# Patient Record
Sex: Female | Born: 1970 | Hispanic: Yes | Marital: Single | Smoking: Never smoker
Health system: Southern US, Community
[De-identification: ages and names within clinical notes are randomized; demographics above are authoritative.]

## PROBLEM LIST (undated history)

## (undated) DIAGNOSIS — T7840XA Allergy, unspecified, initial encounter: Secondary | ICD-10-CM

## (undated) DIAGNOSIS — C801 Malignant (primary) neoplasm, unspecified: Secondary | ICD-10-CM

## (undated) DIAGNOSIS — G4730 Sleep apnea, unspecified: Secondary | ICD-10-CM

## (undated) DIAGNOSIS — D649 Anemia, unspecified: Secondary | ICD-10-CM

## (undated) DIAGNOSIS — F32A Depression, unspecified: Secondary | ICD-10-CM

## (undated) DIAGNOSIS — R87629 Unspecified abnormal cytological findings in specimens from vagina: Secondary | ICD-10-CM

## (undated) DIAGNOSIS — F419 Anxiety disorder, unspecified: Secondary | ICD-10-CM

## (undated) HISTORY — DX: Allergy, unspecified, initial encounter: T78.40XA

## (undated) HISTORY — DX: Unspecified abnormal cytological findings in specimens from vagina: R87.629

## (undated) HISTORY — DX: Sleep apnea, unspecified: G47.30

## (undated) HISTORY — DX: Malignant (primary) neoplasm, unspecified: C80.1

## (undated) HISTORY — PX: HYSTEROSCOPY WITH D & C: SHX1775

## (undated) HISTORY — DX: Anxiety disorder, unspecified: F41.9

## (undated) HISTORY — PX: BREAST REDUCTION WITH MASTOPEXY: SHX6465

## (undated) HISTORY — DX: Anemia, unspecified: D64.9

## (undated) HISTORY — PX: BUNIONECTOMY: SHX129

## (undated) HISTORY — DX: Depression, unspecified: F32.A

## (undated) HISTORY — PX: LAPAROSCOPIC GASTRIC SLEEVE RESECTION: SHX5895

## (undated) HISTORY — PX: ABDOMINOPLASTY: SUR9

---

## 2021-01-15 DIAGNOSIS — G5791 Unspecified mononeuropathy of right lower limb: Secondary | ICD-10-CM | POA: Insufficient documentation

## 2021-01-15 DIAGNOSIS — M2021 Hallux rigidus, right foot: Secondary | ICD-10-CM | POA: Insufficient documentation

## 2021-01-19 ENCOUNTER — Other Ambulatory Visit: Payer: Self-pay

## 2021-01-19 ENCOUNTER — Emergency Department (HOSPITAL_BASED_OUTPATIENT_CLINIC_OR_DEPARTMENT_OTHER)
Admission: EM | Admit: 2021-01-19 | Discharge: 2021-01-19 | Disposition: A | Discharge status: Home / Self Care | Payer: 59 | Attending: Emergency Medicine | Admitting: Emergency Medicine

## 2021-01-19 ENCOUNTER — Encounter (HOSPITAL_BASED_OUTPATIENT_CLINIC_OR_DEPARTMENT_OTHER): Payer: Self-pay

## 2021-01-19 DIAGNOSIS — S91011A Laceration without foreign body, right ankle, initial encounter: Secondary | ICD-10-CM | POA: Diagnosis not present

## 2021-01-19 DIAGNOSIS — S99911A Unspecified injury of right ankle, initial encounter: Secondary | ICD-10-CM | POA: Diagnosis present

## 2021-01-19 DIAGNOSIS — Z23 Encounter for immunization: Secondary | ICD-10-CM | POA: Insufficient documentation

## 2021-01-19 DIAGNOSIS — W268XXA Contact with other sharp object(s), not elsewhere classified, initial encounter: Secondary | ICD-10-CM | POA: Diagnosis not present

## 2021-01-19 MED ORDER — TETANUS-DIPHTH-ACELL PERTUSSIS 5-2.5-18.5 LF-MCG/0.5 IM SUSY
0.5000 mL | PREFILLED_SYRINGE | Freq: Once | INTRAMUSCULAR | Status: AC
  Administered 2021-01-19: 0.5 mL via INTRAMUSCULAR
  Filled 2021-01-19: qty 0.5

## 2021-01-19 MED ORDER — BACITRACIN ZINC 500 UNIT/GM EX OINT
TOPICAL_OINTMENT | Freq: Two times a day (BID) | CUTANEOUS | Status: DC
  Filled 2021-01-19: qty 28.35

## 2021-01-19 NOTE — ED Triage Notes (Signed)
Pt arrives with laceration to right ankle since yesterday

## 2021-01-19 NOTE — ED Provider Notes (Signed)
[facility] EMERGENCY DEPARTMENT Provider Note   CSN: [id number] Arrival date & time: 01/19/21  1118     History Chief Complaint  Patient presents with  . Laceration    Sydney Marshall is a 50 y.o. female.  50 year old female presents with complaint of laceration to her right ankle which occurred yesterday when she cut her ankle on the metal edge from her dryer vent.  Bleeding is controlled, last tetanus was less than 5 years ago.  No other injuries, no difficulty ambulating.        No past medical history on file.  There are no problems to display for this patient.     OB History   No obstetric history on file.     No family history on file.     Home Medications Prior to Admission medications   Not on File    Allergies    Patient has no known allergies.  Review of Systems   Review of Systems  Constitutional: Negative for fever.  Musculoskeletal: Negative for arthralgias, gait problem and myalgias.  Skin: Positive for wound.  Neurological: Negative for weakness and numbness.  Hematological: Does not bruise/bleed easily.    Physical Exam Updated Vital Signs BP 130/82 (BP Location: Right Arm)   Pulse 89   Temp 98.9 F (37.2 C) (Oral)   Resp 19   Ht 5\' 4"  (1.626 m)   Wt 81.6 kg   SpO2 99%   BMI 30.90 kg/m   Physical Exam Vitals and nursing note reviewed.  Constitutional:      General: She is not in acute distress.    Appearance: She is well-developed. She is not diaphoretic.  HENT:     Head: Normocephalic and atraumatic.  Cardiovascular:     Pulses: Normal pulses.  Pulmonary:     Effort: Pulmonary effort is normal.  Musculoskeletal:        General: Tenderness and signs of injury present.     Comments: 3 cm laceration to posterior lateral right ankle below the malleolus.  Bleeding is controlled.  Skin:    General: Skin is warm and dry.     Findings: No erythema or rash.  Neurological:     Mental Status: She is alert and  oriented to person, place, and time.     Sensory: No sensory deficit.     Motor: No weakness.     Gait: Gait normal.  Psychiatric:        Behavior: Behavior normal.     ED Results / Procedures / Treatments   Labs (all labs ordered are listed, but only abnormal results are displayed) Labs Reviewed - No data to display  EKG None  Radiology No results found.  Procedures Procedures   Medications Ordered in ED Medications  bacitracin ointment (has no administration in time range)    ED Course  I have reviewed the triage vital signs and the nursing notes.  Pertinent labs & imaging results that were available during my care of the patient were reviewed by me and considered in my medical decision making (see chart for details).  Clinical Course as of 01/19/21 1137  Tue Jan 19, 7354  2466 50 year old female with laceration to the right ankle which occurred yesterday.  On exam has a 3 cm laceration to the right posterior lateral ankle below the malleolus, wound edges retracted, wound is not bleeding.  At this time, I feel like closure would put her more at risk for developing infection.  Wound will  be cleaned and dressed with antibiotic ointment. [LM]    Clinical Course User Index [LM] Roque Lias   MDM Rules/Calculators/A&P                          Final Clinical Impression(s) / ED Diagnoses Final diagnoses:  Laceration of right ankle, initial encounter    Rx / DC Orders ED Discharge Orders    None       Tacy Learn, PA-C 01/19/21 1137    Long, Wonda Old[facility], MD 01/22/21 [phone]

## 2021-04-30 ENCOUNTER — Encounter: Payer: Self-pay | Admitting: Family Medicine

## 2021-04-30 ENCOUNTER — Other Ambulatory Visit (HOSPITAL_COMMUNITY)
Admission: RE | Admit: 2021-04-30 | Discharge: 2021-04-30 | Disposition: A | Discharge status: Home / Self Care | Payer: 59 | Source: Ambulatory Visit | Attending: Family Medicine | Admitting: Family Medicine

## 2021-04-30 ENCOUNTER — Other Ambulatory Visit: Payer: Self-pay

## 2021-04-30 ENCOUNTER — Ambulatory Visit (INDEPENDENT_AMBULATORY_CARE_PROVIDER_SITE_OTHER): Payer: 59 | Admitting: Family Medicine

## 2021-04-30 VITALS — BP 115/74 | HR 84 | Ht 63.0 in

## 2021-04-30 DIAGNOSIS — A6004 Herpesviral vulvovaginitis: Secondary | ICD-10-CM

## 2021-04-30 DIAGNOSIS — R87610 Atypical squamous cells of undetermined significance on cytologic smear of cervix (ASC-US): Secondary | ICD-10-CM

## 2021-04-30 DIAGNOSIS — Z124 Encounter for screening for malignant neoplasm of cervix: Secondary | ICD-10-CM

## 2021-04-30 DIAGNOSIS — Z01419 Encounter for gynecological examination (general) (routine) without abnormal findings: Secondary | ICD-10-CM

## 2021-04-30 DIAGNOSIS — R87810 Cervical high risk human papillomavirus (HPV) DNA test positive: Secondary | ICD-10-CM

## 2021-04-30 MED ORDER — VALACYCLOVIR HCL 1 G PO TABS: 500.0000 mg | ORAL_TABLET | Freq: Every day | ORAL | 0 refills | Status: DC

## 2021-04-30 NOTE — Progress Notes (Signed)
Hx of Auscus pap +HPv in March 2021 with [facility]. Kathrene Alu Rn  Last Mammogram done 12/30/2019 Normal- did have scattered fibroglandular densities. Kathrene Alu RN

## 2021-04-30 NOTE — Assessment & Plan Note (Signed)
Start on Suppression

## 2021-04-30 NOTE — Assessment & Plan Note (Signed)
Repeat pap this year.

## 2021-04-30 NOTE — Progress Notes (Signed)
Subjective:     Sydney Marshall is a 50 y.o. female and is here for a comprehensive physical exam. The patient reports problems - h/o ASC-US with + HPV s/p colpo last year. She, also had some abnormal bleeding during that time and underwent a D & C with hysteroscopy and cervical polyp removal. Normal pathology seen.   The following portions of the patient's history were reviewed and updated as appropriate: allergies, current medications, past family history, past medical history, past social history, past surgical history, and problem list.  Review of Systems Pertinent items noted in HPI and remainder of comprehensive ROS otherwise negative.   Objective:    BP 115/74   Pulse 84   Ht '5\' 3"'$  (1.6 m)   BMI 31.89 kg/m  General appearance: alert, cooperative, and appears stated age Head: Normocephalic, without obvious abnormality, atraumatic Neck: no adenopathy, supple, symmetrical, trachea midline, and thyroid not enlarged, symmetric, no tenderness/mass/nodules Lungs: clear to auscultation bilaterally Breasts:  no mass, over her scars, suture is felt easily under the skin, which is irritating to her Heart: regular rate and rhythm, S1, S2 normal, no murmur, click, rub or gallop Abdomen: soft, non-tender; bowel sounds normal; no masses,  no organomegaly Pelvic: cervix normal in appearance, external genitalia normal, no adnexal masses or tenderness, no cervical motion tenderness, uterus normal size, shape, and consistency, and vagina normal without discharge Extremities: extremities normal, atraumatic, no cyanosis or edema Pulses: 2+ and symmetric Skin: Skin color, texture, turgor normal. No rashes or lesions Lymph nodes: Cervical, supraclavicular, and axillary nodes normal. Neurologic: Grossly normal    Assessment:    Healthy female exam.      Plan:   Problem List Items Addressed This Visit       Unprioritized   ASCUS with positive high risk HPV cervical    Repeat pap this year.        Relevant Orders   Cytology - PAP( [facility])   Herpes simplex vulvovaginitis    Start on Suppression       Relevant Medications   valACYclovir (VALTREX) 1000 MG tablet   Other Visit Diagnoses     Encounter for gynecological examination without abnormal finding    -  Primary   Relevant Orders   MM Digital Screening   Ambulatory referral to Gastroenterology   Screening for malignant neoplasm of cervix       Relevant Orders   Cytology - PAP( [facility])      Return in 1 year (on 04/30/2022).    See After Visit Summary for Counseling Recommendations

## 2021-05-04 LAB — CYTOLOGY - PAP
Adequacy: ABSENT
Comment: NEGATIVE
Diagnosis: NEGATIVE
High risk HPV: NEGATIVE

## 2021-05-10 ENCOUNTER — Other Ambulatory Visit: Payer: Self-pay

## 2021-05-10 MED ORDER — FLUCONAZOLE 150 MG PO TABS: 150.0000 mg | ORAL_TABLET | Freq: Once | ORAL | 1 refills | Status: AC

## 2021-05-10 NOTE — Telephone Encounter (Signed)
Patient saw on her pap smear she has yeast infection. She called to say she is still having burning sensation. Sent in diflucan per protocol for patient to take. Patient instructed to call back if the the diflucan does not help her. Kathrene Alu RN

## 2021-05-25 ENCOUNTER — Other Ambulatory Visit: Payer: Self-pay

## 2021-05-25 ENCOUNTER — Encounter (HOSPITAL_BASED_OUTPATIENT_CLINIC_OR_DEPARTMENT_OTHER): Payer: Self-pay

## 2021-05-25 ENCOUNTER — Ambulatory Visit (HOSPITAL_BASED_OUTPATIENT_CLINIC_OR_DEPARTMENT_OTHER)
Admission: RE | Admit: 2021-05-25 | Discharge: 2021-05-25 | Disposition: A | Discharge status: Home / Self Care | Payer: 59 | Source: Ambulatory Visit | Attending: Family Medicine | Admitting: Family Medicine

## 2021-05-25 DIAGNOSIS — Z01419 Encounter for gynecological examination (general) (routine) without abnormal findings: Secondary | ICD-10-CM

## 2021-05-25 DIAGNOSIS — Z1231 Encounter for screening mammogram for malignant neoplasm of breast: Secondary | ICD-10-CM | POA: Insufficient documentation

## 2021-05-28 ENCOUNTER — Other Ambulatory Visit: Payer: Self-pay

## 2021-05-28 DIAGNOSIS — A6004 Herpesviral vulvovaginitis: Secondary | ICD-10-CM

## 2021-05-28 MED ORDER — FLUCONAZOLE 150 MG PO TABS: 150.0000 mg | ORAL_TABLET | Freq: Once | ORAL | 0 refills | Status: AC

## 2021-05-28 MED ORDER — VALACYCLOVIR HCL 1 G PO TABS: 500.0000 mg | ORAL_TABLET | Freq: Every day | ORAL | 0 refills | Status: DC

## 2021-05-28 NOTE — Telephone Encounter (Signed)
Patient called and is in need of refill of her valtrex. Kathrene Alu RN

## 2021-10-20 ENCOUNTER — Telehealth: Payer: Self-pay

## 2021-10-20 DIAGNOSIS — A6004 Herpesviral vulvovaginitis: Secondary | ICD-10-CM

## 2021-10-20 MED ORDER — VALACYCLOVIR HCL 1 G PO TABS: 500.0000 mg | ORAL_TABLET | Freq: Every day | ORAL | 1 refills | Status: DC

## 2021-10-20 NOTE — Telephone Encounter (Signed)
Patient needs refill on valtrex. Kathrene Alu RN

## 2021-12-02 ENCOUNTER — Emergency Department (HOSPITAL_BASED_OUTPATIENT_CLINIC_OR_DEPARTMENT_OTHER): Payer: 59

## 2021-12-02 ENCOUNTER — Other Ambulatory Visit: Payer: Self-pay

## 2021-12-02 ENCOUNTER — Encounter (HOSPITAL_BASED_OUTPATIENT_CLINIC_OR_DEPARTMENT_OTHER): Payer: Self-pay

## 2021-12-02 ENCOUNTER — Emergency Department (HOSPITAL_BASED_OUTPATIENT_CLINIC_OR_DEPARTMENT_OTHER)
Admission: EM | Admit: 2021-12-02 | Discharge: 2021-12-02 | Disposition: A | Discharge status: Home / Self Care | Payer: 59 | Attending: Emergency Medicine | Admitting: Emergency Medicine

## 2021-12-02 DIAGNOSIS — R0789 Other chest pain: Secondary | ICD-10-CM | POA: Diagnosis not present

## 2021-12-02 DIAGNOSIS — Y92410 Unspecified street and highway as the place of occurrence of the external cause: Secondary | ICD-10-CM | POA: Insufficient documentation

## 2021-12-02 DIAGNOSIS — S199XXA Unspecified injury of neck, initial encounter: Secondary | ICD-10-CM | POA: Diagnosis present

## 2021-12-02 DIAGNOSIS — M25512 Pain in left shoulder: Secondary | ICD-10-CM | POA: Insufficient documentation

## 2021-12-02 DIAGNOSIS — M7918 Myalgia, other site: Secondary | ICD-10-CM

## 2021-12-02 DIAGNOSIS — S161XXA Strain of muscle, fascia and tendon at neck level, initial encounter: Secondary | ICD-10-CM

## 2021-12-02 MED ORDER — METHYLPREDNISOLONE 4 MG PO TBPK: ORAL_TABLET | ORAL | 0 refills | Status: DC

## 2021-12-02 MED ORDER — KETOROLAC TROMETHAMINE 60 MG/2ML IM SOLN
60.0000 mg | Freq: Once | INTRAMUSCULAR | Status: AC
  Administered 2021-12-02: 14:00:00 60 mg via INTRAMUSCULAR
  Filled 2021-12-02: qty 2

## 2021-12-02 MED ORDER — LIDOCAINE 5 % EX PTCH
1.0000 | MEDICATED_PATCH | CUTANEOUS | Status: DC
  Administered 2021-12-02: 14:00:00 1 via TRANSDERMAL
  Filled 2021-12-02: qty 1
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
## 2021-12-09 ENCOUNTER — Ambulatory Visit: Payer: 59 | Admitting: Family Medicine

## 2021-12-15 ENCOUNTER — Encounter: Payer: Self-pay | Admitting: Family Medicine

## 2021-12-15 ENCOUNTER — Ambulatory Visit: Payer: 59 | Admitting: Family Medicine

## 2021-12-15 VITALS — BP 114/80 | Ht 64.0 in | Wt 224.0 lb

## 2021-12-15 DIAGNOSIS — S40012A Contusion of left shoulder, initial encounter: Secondary | ICD-10-CM | POA: Diagnosis not present

## 2021-12-15 DIAGNOSIS — S39012A Strain of muscle, fascia and tendon of lower back, initial encounter: Secondary | ICD-10-CM | POA: Diagnosis not present

## 2021-12-15 DIAGNOSIS — S161XXA Strain of muscle, fascia and tendon at neck level, initial encounter: Secondary | ICD-10-CM | POA: Diagnosis not present

## 2021-12-15 DIAGNOSIS — M5416 Radiculopathy, lumbar region: Secondary | ICD-10-CM | POA: Insufficient documentation
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
## 2021-12-15 NOTE — Patient Instructions (Signed)
Nice to meet you ?Please try heat  ?Please try the exercises   ?Please send me a message in MyChart with any questions or updates.  ?Please see me back in 3 weeks.  ? ?--Dr. Raeford Razor ? ?

## 2021-12-20 ENCOUNTER — Ambulatory Visit: Payer: 59 | Attending: Family Medicine | Admitting: Physical Therapy

## 2021-12-20 ENCOUNTER — Other Ambulatory Visit: Payer: Self-pay

## 2021-12-20 ENCOUNTER — Encounter: Payer: Self-pay | Admitting: Physical Therapy

## 2021-12-20 DIAGNOSIS — M546 Pain in thoracic spine: Secondary | ICD-10-CM | POA: Diagnosis present

## 2021-12-20 DIAGNOSIS — S40012A Contusion of left shoulder, initial encounter: Secondary | ICD-10-CM | POA: Diagnosis not present

## 2021-12-20 DIAGNOSIS — R293 Abnormal posture: Secondary | ICD-10-CM | POA: Insufficient documentation

## 2021-12-20 DIAGNOSIS — S161XXA Strain of muscle, fascia and tendon at neck level, initial encounter: Secondary | ICD-10-CM | POA: Diagnosis not present

## 2021-12-20 DIAGNOSIS — S39012A Strain of muscle, fascia and tendon of lower back, initial encounter: Secondary | ICD-10-CM | POA: Diagnosis not present

## 2021-12-20 DIAGNOSIS — M6281 Muscle weakness (generalized): Secondary | ICD-10-CM | POA: Insufficient documentation

## 2021-12-20 DIAGNOSIS — M542 Cervicalgia: Secondary | ICD-10-CM | POA: Insufficient documentation

## 2021-12-20 DIAGNOSIS — M25512 Pain in left shoulder: Secondary | ICD-10-CM | POA: Diagnosis present

## 2021-12-20 DIAGNOSIS — M5441 Lumbago with sciatica, right side: Secondary | ICD-10-CM | POA: Insufficient documentation

## 2021-12-20 DIAGNOSIS — M5413 Radiculopathy, cervicothoracic region: Secondary | ICD-10-CM | POA: Diagnosis present

## 2021-12-20 DIAGNOSIS — M5442 Lumbago with sciatica, left side: Secondary | ICD-10-CM | POA: Insufficient documentation

## 2021-12-20 DIAGNOSIS — M25612 Stiffness of left shoulder, not elsewhere classified: Secondary | ICD-10-CM | POA: Diagnosis present
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
## 2021-12-23 ENCOUNTER — Ambulatory Visit: Payer: 59

## 2021-12-23 DIAGNOSIS — M25512 Pain in left shoulder: Secondary | ICD-10-CM

## 2021-12-23 DIAGNOSIS — M6281 Muscle weakness (generalized): Secondary | ICD-10-CM

## 2021-12-23 DIAGNOSIS — M25612 Stiffness of left shoulder, not elsewhere classified: Secondary | ICD-10-CM

## 2021-12-23 DIAGNOSIS — M5441 Lumbago with sciatica, right side: Secondary | ICD-10-CM

## 2021-12-23 DIAGNOSIS — M542 Cervicalgia: Secondary | ICD-10-CM

## 2021-12-23 DIAGNOSIS — M546 Pain in thoracic spine: Secondary | ICD-10-CM

## 2021-12-23 DIAGNOSIS — R293 Abnormal posture: Secondary | ICD-10-CM

## 2021-12-23 DIAGNOSIS — M5413 Radiculopathy, cervicothoracic region: Secondary | ICD-10-CM

## 2021-12-23 NOTE — Patient Instructions (Signed)
Access Code: [email or web address] ?URL: [email or web address] ?Date: 12/23/2021 ?Prepared by: Clarene Essex ? ?Exercises ?- Seated Scapular Retraction  - 2 x daily - 7 x weekly - 2 sets - 10 reps - 5 sec hold ?- Seated Cervical Retraction  - 2 x daily - 7 x weekly - 2 sets - 10 reps ?- Supine Lower Trunk Rotation  - 2 x daily - 7 x weekly - 2 sets - 10 reps - 5 sec hold ?- Supine Posterior Pelvic Tilt  - 2 x daily - 7 x weekly - 2 sets - 10 reps - 5 sec hold ?
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
## 2021-12-30 ENCOUNTER — Ambulatory Visit: Payer: 59 | Admitting: Physical Therapy

## 2022-01-03 ENCOUNTER — Ambulatory Visit: Payer: 59 | Admitting: Family Medicine

## 2022-01-05 ENCOUNTER — Ambulatory Visit: Payer: 59 | Attending: Family Medicine | Admitting: Physical Therapy

## 2022-01-05 ENCOUNTER — Encounter: Payer: Self-pay | Admitting: Physical Therapy

## 2022-01-05 DIAGNOSIS — M25612 Stiffness of left shoulder, not elsewhere classified: Secondary | ICD-10-CM

## 2022-01-05 DIAGNOSIS — M5441 Lumbago with sciatica, right side: Secondary | ICD-10-CM | POA: Diagnosis present

## 2022-01-05 DIAGNOSIS — M542 Cervicalgia: Secondary | ICD-10-CM

## 2022-01-05 DIAGNOSIS — M5442 Lumbago with sciatica, left side: Secondary | ICD-10-CM | POA: Insufficient documentation

## 2022-01-05 DIAGNOSIS — M25512 Pain in left shoulder: Secondary | ICD-10-CM | POA: Diagnosis present

## 2022-01-05 DIAGNOSIS — R293 Abnormal posture: Secondary | ICD-10-CM

## 2022-01-05 DIAGNOSIS — M546 Pain in thoracic spine: Secondary | ICD-10-CM

## 2022-01-05 DIAGNOSIS — M6281 Muscle weakness (generalized): Secondary | ICD-10-CM | POA: Diagnosis present

## 2022-01-05 DIAGNOSIS — M5413 Radiculopathy, cervicothoracic region: Secondary | ICD-10-CM
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
## 2022-01-07 ENCOUNTER — Ambulatory Visit: Payer: 59

## 2022-01-10 ENCOUNTER — Ambulatory Visit: Payer: 59

## 2022-01-10 DIAGNOSIS — M25612 Stiffness of left shoulder, not elsewhere classified: Secondary | ICD-10-CM

## 2022-01-10 DIAGNOSIS — R293 Abnormal posture: Secondary | ICD-10-CM

## 2022-01-10 DIAGNOSIS — M542 Cervicalgia: Secondary | ICD-10-CM | POA: Diagnosis not present

## 2022-01-10 DIAGNOSIS — M5442 Lumbago with sciatica, left side: Secondary | ICD-10-CM

## 2022-01-10 DIAGNOSIS — M25512 Pain in left shoulder: Secondary | ICD-10-CM

## 2022-01-10 DIAGNOSIS — M546 Pain in thoracic spine: Secondary | ICD-10-CM

## 2022-01-10 DIAGNOSIS — M6281 Muscle weakness (generalized): Secondary | ICD-10-CM

## 2022-01-10 DIAGNOSIS — M5413 Radiculopathy, cervicothoracic region: Secondary | ICD-10-CM
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
## 2022-01-13 ENCOUNTER — Ambulatory Visit: Payer: 59

## 2022-01-13 DIAGNOSIS — R293 Abnormal posture: Secondary | ICD-10-CM

## 2022-01-13 DIAGNOSIS — M25612 Stiffness of left shoulder, not elsewhere classified: Secondary | ICD-10-CM

## 2022-01-13 DIAGNOSIS — M542 Cervicalgia: Secondary | ICD-10-CM

## 2022-01-13 DIAGNOSIS — M5441 Lumbago with sciatica, right side: Secondary | ICD-10-CM

## 2022-01-13 DIAGNOSIS — M546 Pain in thoracic spine: Secondary | ICD-10-CM

## 2022-01-13 DIAGNOSIS — M6281 Muscle weakness (generalized): Secondary | ICD-10-CM

## 2022-01-13 DIAGNOSIS — M5413 Radiculopathy, cervicothoracic region: Secondary | ICD-10-CM

## 2022-01-13 DIAGNOSIS — M25512 Pain in left shoulder: Secondary | ICD-10-CM
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
## 2022-01-17 ENCOUNTER — Encounter: Payer: Self-pay | Admitting: Physical Therapy

## 2022-01-17 ENCOUNTER — Ambulatory Visit: Payer: 59 | Admitting: Physical Therapy

## 2022-01-17 DIAGNOSIS — M542 Cervicalgia: Secondary | ICD-10-CM

## 2022-01-17 DIAGNOSIS — M25612 Stiffness of left shoulder, not elsewhere classified: Secondary | ICD-10-CM

## 2022-01-17 DIAGNOSIS — M5441 Lumbago with sciatica, right side: Secondary | ICD-10-CM

## 2022-01-17 DIAGNOSIS — M25512 Pain in left shoulder: Secondary | ICD-10-CM

## 2022-01-17 DIAGNOSIS — M5413 Radiculopathy, cervicothoracic region: Secondary | ICD-10-CM

## 2022-01-17 DIAGNOSIS — M546 Pain in thoracic spine: Secondary | ICD-10-CM

## 2022-01-17 DIAGNOSIS — M6281 Muscle weakness (generalized): Secondary | ICD-10-CM

## 2022-01-17 DIAGNOSIS — R293 Abnormal posture: Secondary | ICD-10-CM
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
## 2022-01-20 ENCOUNTER — Ambulatory Visit: Payer: 59

## 2022-01-24 ENCOUNTER — Encounter: Payer: Self-pay | Admitting: Family Medicine

## 2022-01-24 ENCOUNTER — Ambulatory Visit (INDEPENDENT_AMBULATORY_CARE_PROVIDER_SITE_OTHER): Payer: 59 | Admitting: Family Medicine

## 2022-01-24 VITALS — BP 116/73 | HR 79 | Resp 20 | Ht 64.0 in | Wt 226.0 lb

## 2022-01-24 DIAGNOSIS — Z7689 Persons encountering health services in other specified circumstances: Secondary | ICD-10-CM | POA: Diagnosis not present

## 2022-01-24 DIAGNOSIS — G4733 Obstructive sleep apnea (adult) (pediatric): Secondary | ICD-10-CM

## 2022-01-24 DIAGNOSIS — E669 Obesity, unspecified: Secondary | ICD-10-CM

## 2022-01-24 DIAGNOSIS — Z6838 Body mass index (BMI) 38.0-38.9, adult: Secondary | ICD-10-CM | POA: Diagnosis not present

## 2022-01-24 NOTE — Progress Notes (Signed)
? ?New Patient Office Visit ? ?Subjective   ? ?Patient ID: Sydney Marshall, female    DOB: [birth year]  Age: 51 y.o. MRN: [id number] ? ?CC:  ?Chief Complaint  ?Patient presents with  ? Establish Care  ? ? ?HPI ?Sydney Marshall presents to establish care ? ? ?Sleep apnea: She reports trouble concentrating during the day, restless less, daytime sleepiness/fatigue, snoring, waking up gasping for air. Reports last sleep study was in [place] 10+ years ago. She is not sure if her CPAP is on the right settings now, because she is feeling as poorly as she did when she was first diagnosed.  ? ? ?Obesity: Reports she had gastric sleeve in 2013, but put the weight back on. She states she does not eat large portions, and tries to ear fairly healthy and exercise at the gym at least 3 days per week. She thinks some of her weight gain is related to trouble sleeping. She is interested in weight loss medications if that is an option.   ? ? ? ? ?Outpatient Encounter Medications as of 01/24/2022  ?Medication Sig  ? valACYclovir (VALTREX) 1000 MG tablet Take 0.5 tablets (500 mg total) by mouth daily.  ? [DISCONTINUED] gabapentin (NEURONTIN) 300 MG capsule gabapentin 300 mg capsule ? TAKE 1 CAPSULE BY MOUTH TWICE DAILY. MAY INCREASE TO 3 TIMES PER DAY  ? [DISCONTINUED] meloxicam (MOBIC) 15 MG tablet meloxicam 15 mg tablet ? TAKE 1 TABLET BY MOUTH EVERY DAY WITH A MEAL  ? [DISCONTINUED] methylPREDNISolone (MEDROL DOSEPAK) 4 MG TBPK tablet Follow package insert  ? ?No facility-administered encounter medications on file as of 01/24/2022.  ? ? ?Past Medical History:  ?Diagnosis Date  ? Allergy   ? Anemia   ? Anxiety   ? Cancer [facility])   ? Depression   ? Sleep apnea   ? Vaginal Pap smear, abnormal   ? ? ?Past Surgical History:  ?Procedure Laterality Date  ? ABDOMINOPLASTY    ? BREAST REDUCTION WITH MASTOPEXY    ? BUNIONECTOMY    ? HYSTEROSCOPY WITH D & C    ? polypectomy  ? LAPAROSCOPIC GASTRIC SLEEVE RESECTION    ? ? ?Family History  ?Problem Relation Age  of Onset  ? Learning disabilities Mother   ? Hyperlipidemia Mother   ? Diabetes Mother   ? Arthritis Mother   ? Hypertension Mother   ? Early death Father   ? Hypertension Father   ? Heart disease Father   ? Heart attack Father   ? Cancer Paternal Uncle   ?     brain  ? Breast cancer Cousin   ? ? ?Social History  ? ?Socioeconomic History  ? Marital status: Single  ?  Spouse name: Not on file  ? Number of children: Not on file  ? Years of education: Not on file  ? Highest education level: Not on file  ?Occupational History  ? Not on file  ?Tobacco Use  ? Smoking status: Never  ? Smokeless tobacco: Never  ?Vaping Use  ? Vaping Use: Never used  ?Substance and Sexual Activity  ? Alcohol use: Never  ? Drug use: Never  ? Sexual activity: Yes  ?  Birth control/protection: None  ?Other Topics Concern  ? Not on file  ?Social History Narrative  ? Not on file  ? ?Social Determinants of Health  ? ?Financial Resource Strain: Not on file  ?Food Insecurity: Not on file  ?Transportation Needs: Not on file  ?Physical Activity: Not on file  ?  Stress: Not on file  ?Social Connections: Not on file  ?Intimate Partner Violence: Not on file  ? ? ?ROS ?All review of systems negative except what is listed in the HPI ? ?  ? ? ?Objective   ? ?BP 116/73 (BP Location: Left Arm, Patient Position: Sitting, Cuff Size: Normal)   Pulse 79   Resp 20   Ht '5\' 4"'$  (1.626 m)   Wt 226 lb (102.5 kg)   SpO2 99%   BMI 38.79 kg/m?  ? ?Physical Exam ?Vitals reviewed.  ?Constitutional:   ?   Appearance: Normal appearance. She is obese.  ?HENT:  ?   Head: Normocephalic and atraumatic.  ?Cardiovascular:  ?   Rate and Rhythm: Normal rate and regular rhythm.  ?Pulmonary:  ?   Effort: Pulmonary effort is normal.  ?   Breath sounds: Normal breath sounds.  ?Musculoskeletal:  ?   Right lower leg: No edema.  ?   Left lower leg: Edema present.  ?Skin: ?   General: Skin is warm and dry.  ?Neurological:  ?   General: No focal deficit present.  ?   Mental Status: She is  alert and oriented to person, place, and time. Mental status is at baseline.  ?Psychiatric:     ?   Mood and Affect: Mood normal.     ?   Behavior: Behavior normal.     ?   Thought Content: Thought content normal.     ?   Judgment: Judgment normal.  ? ? ? ?  ? ?Assessment & Plan:  ? ?1. Encounter to establish care ? ? ?2. Class 2 obesity without serious comorbidity with body mass index (BMI) of 38.0 to 38.9 in adult, unspecified obesity type ?Updating labs today. Continue healthy diet, portion control, and regular exercise. We can discuss medication options pending lab results.  ?- Hemoglobin A1c ?- CBC ?- Comprehensive metabolic panel ?- Lipid panel ?- TSH ? ?3. OSA (obstructive sleep apnea) ?Referral placed for pulmonology to consider repeat sleep study and changing CPAP settings.  ?- Ambulatory referral to Pulmonology ? ?Return for pending labs, physical at your convenience .  ? ?Terrilyn Saver, NP ? ? ?

## 2022-01-24 NOTE — Patient Instructions (Signed)
Thank you for choosing  Primary Care at [facility] for your Primary Care needs. I am excited for the opportunity to partner with you to meet your health care goals. It was a pleasure meeting you today! ? ? ? ?Information on diet, exercise, and health maintenance recommendations are listed below. This is information to help you be sure you are on track for optimal health and monitoring.  ? ?Please look over this and let us know if you have any questions or if you have completed any of the health maintenance outside of [facility] so that we can be sure your records are up to date.  ?___________________________________________________________ ? ?MyChart:  ?For all urgent or time sensitive needs we ask that you please call the office to avoid delays. Our number is [phone]. ?MyChart is not constantly monitored and due to the large volume of messages a day, replies may take up to 72 business hours. ? ?MyChart Policy: ?MyChart allows for you to see your visit notes, after visit summary, provider recommendations, lab and tests results, make an appointment, request refills, and contact your provider or the office for non-urgent questions or concerns. Providers are seeing patients during normal business hours and do not have built in time to review MyChart messages.  ?We ask that you allow a minimum of 3 business days for responses to [employer]. For this reason, please do not send urgent requests through [place]. Please call the office at [phone]. ?New and ongoing conditions may require a visit. We have virtual and in-person visits available for your convenience.  ?Complex MyChart concerns may require a visit. Your provider may request you schedule a virtual or in-person visit to ensure we are providing the best care possible. ?MyChart messages sent after 11:00 AM on Friday will not be received by the provider until Monday morning.  ?  ?Lab and Test Results: ?You will receive your lab and  test results on MyChart as soon as they are completed and results have been sent by the lab or testing facility. Due to this service, you will receive your results BEFORE your provider.  ?I review lab and test results each morning prior to seeing patients. Some results require collaboration with other providers to ensure you are receiving the most appropriate care. For this reason, we ask that you please allow a minimum of 3-5 business days from the time that ALL results have been received for your provider to receive and review lab and test results and contact you about these.  ?Most lab and test result comments from the provider will be sent through [facility]. Your provider may recommend changes to the plan of care, follow-up visits, repeat testing, ask questions, or request an office visit to discuss these results. You may reply directly to this message or call the office to provide information for the provider or set up an appointment. ?In some instances, you will be called with test results and recommendations. Please let us know if this is preferred and we will make note of this in your chart to provide this for you.    ?If you have not heard a response to your lab or test results in 5 business days from all results returning to [place], please call the office to let us know. We ask that you please avoid calling prior to this time unless there is an emergent concern. Due to high call volumes, this can delay the resulting process. ? ?After Hours: ?For all non-emergency after hours  needs, please call the office at [phone] and select the option to reach the on-call  service. On-call services are shared between multiple [facility] offices and therefore it will not be possible to speak directly with your provider. On-call providers may provide medical advice and recommendations, but are unable to provide refills for maintenance medications.  ?For all emergency or urgent medical needs after normal business  hours, we recommend that you seek care at the closest Urgent Care or Emergency Department to ensure appropriate treatment in a timely manner.  ?[facility] [place] has a 24 hour emergency room located on the ground floor for your convenience.  ? ?Urgent Concerns During the Business Day ?Providers are seeing patients from 8AM to [place] with a busy schedule and are most often not able to respond to non-urgent calls until the end of the day or the next business day. ?If you should have URGENT concerns during the day, please call and speak to the nurse or schedule a same day appointment so that we can address your concern without delay.  ? ?Thank you, again, for choosing me as your health care partner. I appreciate your trust and look forward to learning more about you.  ? ?Purcell Nails. Olevia Bowens, DNP, FNP-C ? ?___________________________________________________________ ? ?Health Maintenance Recommendations ?Screening Testing ?Mammogram ?Every 1-2 years based on history and risk factors ?Starting at age 65 ?Pap Smear ?Ages 21-39 every 3 years ?Ages 85-65 every 5 years with HPV testing ?More frequent testing may be required based on results and history ?Colon Cancer Screening ?Every 1-10 years based on test performed, risk factors, and history ?Starting at age 5 ?Bone Density Screening ?Every 2-10 years based on history ?Starting at age 49 for women ?Recommendations for men differ based on medication usage, history, and risk factors ?AAA Screening ?One time ultrasound ?Men 79-47 years old who have ever smoked ?Lung Cancer Screening ?Low Dose Lung CT every 12 months ?Age 33-80 years with a 20 pack-year smoking history who still smoke or who have quit within the last 15 years ? ?Screening Labs ?Routine  Labs: Complete Blood Count (CBC), Complete Metabolic Panel (CMP), Cholesterol (Lipid Panel) ?Every 6-12 months based on history and medications ?May be recommended more frequently based on current conditions or  previous results ?Hemoglobin A1c Lab ?Every 3-12 months based on history and previous results ?Starting at age 53 or earlier with diagnosis of diabetes, high cholesterol, BMI >26, and/or risk factors ?Frequent monitoring for patients with diabetes to ensure blood sugar control ?Thyroid Panel (TSH w/ T3 & T4) ?Every 6 months based on history, symptoms, and risk factors ?May be repeated more often if on medication ?HIV ?One time testing for all patients 82 and older ?May be repeated more frequently for patients with increased risk factors or exposure ?Hepatitis C ?One time testing for all patients 61 and older ?May be repeated more frequently for patients with increased risk factors or exposure ?Gonorrhea, Chlamydia ?Every 12 months for all sexually active persons 13-24 years ?Additional monitoring may be recommended for those who are considered high risk or who have symptoms ?PSA ?Men 10-64 years old with risk factors ?Additional screening may be recommended from age 67-69 based on risk factors, symptoms, and history ? ?Vaccine Recommendations ?Tetanus Booster ?All adults every 10 years ?Flu Vaccine ?All patients 6 months and older every year ?COVID Vaccine ?All patients 12 years and older ?Initial dosing with booster ?May recommend additional booster based on age and health history ?HPV Vaccine ?2 doses all  patients age 65-26 ?Dosing may be considered for patients over 26 ?Shingles Vaccine (Shingrix) ?2 doses all adults 50 years and older ?Pneumonia (Pneumovax 23) ?All adults 46 years and older ?May recommend earlier dosing based on health history ?Pneumonia (Prevnar 37) ?All adults 59 years and older ?Dosed 1 year after Pneumovax 23 ?Pneumonia (Prevnar 27) ?All adults 77 years and older (adults 16-10 with certain conditions or risk factors) ?1 dose  ?For those who have no received Prevnar 13 vaccine previously ? ? ?Additional Screening, Testing, and Vaccinations may be recommended on an individualized basis based on  family history, health history, risk factors, and/or exposure.  ?__________________________________________________________ ? ?Diet Recommendations for All Patients ? ?I recommend that all patients maintain a di

## 2022-01-25 ENCOUNTER — Ambulatory Visit: Payer: 59 | Admitting: Physical Therapy

## 2022-01-25 LAB — LIPID PANEL
Cholesterol: 231 mg/dL — ABNORMAL HIGH (ref 0–200)
HDL: 77.9 mg/dL (ref >39.00)
LDL Cholesterol: 136 mg/dL — ABNORMAL HIGH (ref 0–99)
NonHDL: 152.97
Total CHOL/HDL Ratio: 3
Triglycerides: 86 mg/dL (ref 0.0–149.0)
VLDL: 17.2 mg/dL (ref 0.0–40.0)

## 2022-01-25 LAB — COMPREHENSIVE METABOLIC PANEL
ALT: 22 U/L (ref 0–35)
AST: 28 U/L (ref 0–37)
Albumin: 4.2 g/dL (ref 3.5–5.2)
Alkaline Phosphatase: 112 U/L (ref 39–117)
BUN: 23 mg/dL (ref 6–23)
CO2: 27 mEq/L (ref 19–32)
Calcium: 9 mg/dL (ref 8.4–10.5)
Chloride: 104 mEq/L (ref 96–112)
Creatinine, Ser: 0.74 mg/dL (ref 0.40–1.20)
GFR: 93.58 mL/min (ref >60.00)
Glucose, Bld: 82 mg/dL (ref 70–99)
Potassium: 4.9 mEq/L (ref 3.5–5.1)
Sodium: 139 mEq/L (ref 135–145)
Total Bilirubin: 0.4 mg/dL (ref 0.2–1.2)
Total Protein: 7 g/dL (ref 6.0–8.3)

## 2022-01-25 LAB — CBC
HCT: 40.2 % (ref 36.0–46.0)
Hemoglobin: 13.4 g/dL (ref 12.0–15.0)
MCHC: 33.2 g/dL (ref 30.0–36.0)
MCV: 88.8 fl (ref 78.0–100.0)
Platelets: 265 10*3/uL (ref 150.0–400.0)
RBC: 4.53 Mil/uL (ref 3.87–5.11)
RDW: 14.7 % (ref 11.5–15.5)
WBC: 5.9 10*3/uL (ref 4.0–10.5)

## 2022-01-25 LAB — HEMOGLOBIN A1C: Hgb A1c MFr Bld: 5.4 % (ref 4.6–6.5)

## 2022-01-25 LAB — TSH: TSH: 3.21 u[IU]/mL (ref 0.35–5.50)

## 2022-01-31 ENCOUNTER — Ambulatory Visit: Payer: 59 | Admitting: Family Medicine

## 2022-01-31 VITALS — BP 118/84 | Ht 64.0 in | Wt 225.0 lb

## 2022-01-31 DIAGNOSIS — S161XXD Strain of muscle, fascia and tendon at neck level, subsequent encounter: Secondary | ICD-10-CM | POA: Diagnosis not present

## 2022-01-31 DIAGNOSIS — M5416 Radiculopathy, lumbar region: Secondary | ICD-10-CM
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
## 2022-01-31 NOTE — Patient Instructions (Signed)
Good to see you ?We will obtain an MRI of the lumbar spine at the [facility]. ?You can follow-up to have shockwave performed on your upper back and neck ?Please send me a message in MyChart with any questions or updates.  ?We will set up a virtual visit once the MRI is resulted..  ? ?--Dr. Raeford Razor ? ?Que bueno verte ?Obtendremos una resonancia magn?tica de la columna lumbar en el centro m?dico de [place]. ?Fish farm manager un seguimiento para que le realicen una onda de choque en la parte superior de la espalda y el cuello. ?Env?eme un mensaje en MyChart con cualquier pregunta o actualizaci?n. ?Programaremos una visita virtual una vez que se haya realizado la resonancia magn?tica. ?
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
## 2022-02-06 ENCOUNTER — Ambulatory Visit (INDEPENDENT_AMBULATORY_CARE_PROVIDER_SITE_OTHER): Payer: 59

## 2022-02-06 DIAGNOSIS — M5416 Radiculopathy, lumbar region: Secondary | ICD-10-CM

## 2022-02-07 MED ORDER — WEGOVY 0.25 MG/0.5ML ~~LOC~~ SOAJ: 0.2500 mg | SUBCUTANEOUS | 3 refills | Status: DC

## 2022-02-07 NOTE — Progress Notes (Signed)
Patient would like to proceed with trying to get [facility]. She is aware of medications and potential side effects including possibly worse GI symptoms given her history of gastric sleeve. Will order.  ? ?Purcell Nails. Olevia Bowens, DNP, FNP-C ? ?

## 2022-02-07 NOTE — Addendum Note (Signed)
Addended by: Caleen Jobs B on: 02/07/2022 10:24 PM ? ? Modules accepted: Orders ? ?

## 2022-02-08 ENCOUNTER — Ambulatory Visit (INDEPENDENT_AMBULATORY_CARE_PROVIDER_SITE_OTHER): Payer: 59 | Admitting: Family Medicine

## 2022-02-08 ENCOUNTER — Encounter: Payer: Self-pay | Admitting: Family Medicine

## 2022-02-08 VITALS — BP 124/80 | Ht 64.0 in | Wt 225.0 lb

## 2022-02-08 DIAGNOSIS — M5416 Radiculopathy, lumbar region: Secondary | ICD-10-CM | POA: Diagnosis not present

## 2022-02-08 MED ORDER — HYDROCODONE-ACETAMINOPHEN 5-325 MG PO TABS: 1.0000 | ORAL_TABLET | Freq: Three times a day (TID) | ORAL | 0 refills | Status: DC | PRN

## 2022-02-08 NOTE — Patient Instructions (Signed)
Good to see you ?We will send an order for an epidural and they will contact you. ?Please take the pain medicine as needed.  Drink plenty of water and a fiber supplement ?Please send me a message in MyChart with any questions or updates.  ?Please contact me 1 week after the epidural..  ? ?--Dr. Raeford Razor ? ?Que bueno verte ?Enviaremos un pedido de epidural y ellos se comunicar?n con usted. ?Tome el medicamento para el dolor seg?n sea necesario. Karma Greaser agua y un suplemento de [place]. ?Env?eme un mensaje en MyChart con cualquier pregunta o actualizaci?n. ?Por favor cont?cteme 1 semana despu?s de la epidural.. ?
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
## 2022-02-09 ENCOUNTER — Telehealth: Payer: Self-pay | Admitting: *Deleted

## 2022-02-09 NOTE — Telephone Encounter (Signed)
PA submitted for [facility]

## 2022-02-11 ENCOUNTER — Ambulatory Visit (INDEPENDENT_AMBULATORY_CARE_PROVIDER_SITE_OTHER): Payer: 59 | Admitting: Physical Medicine and Rehabilitation

## 2022-02-11 ENCOUNTER — Encounter: Payer: Self-pay | Admitting: Physical Medicine and Rehabilitation

## 2022-02-11 DIAGNOSIS — M48061 Spinal stenosis, lumbar region without neurogenic claudication: Secondary | ICD-10-CM | POA: Diagnosis not present

## 2022-02-11 DIAGNOSIS — M47816 Spondylosis without myelopathy or radiculopathy, lumbar region: Secondary | ICD-10-CM

## 2022-02-11 DIAGNOSIS — M4726 Other spondylosis with radiculopathy, lumbar region: Secondary | ICD-10-CM

## 2022-02-11 DIAGNOSIS — M5416 Radiculopathy, lumbar region: Secondary | ICD-10-CM

## 2022-02-11 MED ORDER — DIAZEPAM 5 MG PO TABS: ORAL_TABLET | ORAL | 0 refills | Status: DC
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
## 2022-02-11 NOTE — Progress Notes (Signed)
Pt state lower back pain that travels down both legs. Pt state walking, standing and laying down makes the pain worse. Pt state the pain makes nausea at times. Pt state she takes pain meds and uses heat to help ease her pain.  Numeric Pain Rating Scale and Functional Assessment Average Pain 10 Pain Right Now 10 My pain is constant, sharp, burning, stabbing, tingling, and aching Pain is worse with: walking, bending, sitting, standing, and some activites Pain improves with: heat/ice and medication   In the last MONTH (on 0-10 scale) has pain interfered with the following?  1. General activity like being  able to carry out your everyday physical activities such as walking, climbing stairs, carrying groceries, or moving a chair?  Rating(5)  2. Relation with others like being able to carry out your usual social activities and roles such as  activities at home, at work and in your community. Rating(6)  3. Enjoyment of life such that you have  been bothered by emotional problems such as feeling anxious, depressed or irritable?  Rating(7)

## 2022-02-11 NOTE — Telephone Encounter (Signed)
Received fax stating that Sydney Marshall is not covered on the pt's formulary.

## 2022-02-14 NOTE — Progress Notes (Unsigned)
$'@Patient'n$  ID: Nena Jordan, female    DOB: May 16, 1971, 51 y.o.   MRN: [id number]  No chief complaint on file.   Referring provider: Terrilyn Saver, NP  HPI: 51 year old female, never smoked. PMH significant for lumbar radiculopathy, cervical strain, herpes.  02/15/2022 Patient presents today for sleep consult.    Allergies  Allergen Reactions   Lactose Intolerance (Gi) Diarrhea and Nausea And Vomiting    Immunization History  Administered Date(s) Administered   Influenza,inj,Quad PF,6+ Mos 07/06/2021   PFIZER(Purple Top)SARS-COV-2 Vaccination 05/17/2020   Tdap 01/19/2021    Past Medical History:  Diagnosis Date   Allergy    Anemia    Anxiety    Cancer ([place])    Depression    Sleep apnea    Vaginal Pap smear, abnormal     Tobacco History: Social History   Tobacco Use  Smoking Status Never  Smokeless Tobacco Never   Counseling given: Not Answered   Outpatient Medications Prior to Visit  Medication Sig Dispense Refill   diazepam (VALIUM) 5 MG tablet Take one tablet by mouth with food one hour prior to procedure. May repeat 30 minutes prior if needed. 2 tablet 0   HYDROcodone-acetaminophen (NORCO/VICODIN) 5-325 MG tablet Take 1 tablet by mouth every 8 (eight) hours as needed. 12 tablet 0   Semaglutide-Weight Management (WEGOVY) 0.25 MG/0.5ML SOAJ Inject 0.25 mg into the skin once a week. After 4 weeks, can increase to 0.5 mg weekly for 4 weeks, then 1 mg weekly for 4 weeks 2 mL 3   valACYclovir (VALTREX) 1000 MG tablet Take 0.5 tablets (500 mg total) by mouth daily. 90 tablet 1   No facility-administered medications prior to visit.      Review of Systems  Review of Systems   Physical Exam  There were no vitals taken for this visit. Physical Exam   Lab Results:  CBC    Component Value Date/Time   WBC 5.9 01/24/2022 1510   RBC 4.53 01/24/2022 1510   HGB 13.4 01/24/2022 1510   HCT 40.2 01/24/2022 1510   PLT 265.0 01/24/2022 1510   MCV 88.8  01/24/2022 1510   MCHC 33.2 01/24/2022 1510   RDW 14.7 01/24/2022 1510    BMET    Component Value Date/Time   NA 139 01/24/2022 1510   K 4.9 01/24/2022 1510   CL 104 01/24/2022 1510   CO2 27 01/24/2022 1510   GLUCOSE 82 01/24/2022 1510   BUN 23 01/24/2022 1510   CREATININE 0.74 01/24/2022 1510   CALCIUM 9.0 01/24/2022 1510    BNP No results found for: BNP  ProBNP No results found for: PROBNP  Imaging: MR Lumbar Spine Wo Contrast  Result Date: 02/08/2022 CLINICAL DATA:  51 year old female with lumbar spine pain for 2 months. No known injury. EXAM: MRI LUMBAR SPINE WITHOUT CONTRAST TECHNIQUE: Multiplanar, multisequence MR imaging of the lumbar spine was performed. No intravenous contrast was administered. COMPARISON:  Chest radiographs 12/02/2021. FINDINGS: Segmentation: Lumbar segmentation appears to be normal and will be designated as such for this report. Alignment: Straightening of lumbar lordosis with no significant spondylolisthesis or scoliosis. Vertebrae: Chronic degenerative endplate marrow signal changes at L4-L5. No marrow edema or evidence of acute osseous abnormality. Background bone marrow signal is within normal limits. Intact visible sacrum. Conus medullaris and cauda equina: Conus extends to the T12-L1 level. No lower spinal cord or conus signal abnormality. In general the cauda equina nerve roots are normal. Paraspinal and other soft tissues: Large body habitus  but otherwise negative. Disc levels: Negative visible lower thoracic levels through T12-L1. L1-L2: Mild disc desiccation and small cephalad central disc extrusion (series 6, image 9 and series 3, image 8) located just below the tip of the conus. No significant stenosis. L2-L3:  Mild disc desiccation and subtle disc bulging.  No stenosis. L3-L4: Negative disc. Mild facet and ligament flavum hypertrophy with trace degenerative facet joint fluid. No stenosis. L4-L5: Disc desiccation and disc space loss, especially  anteriorly. Somewhat circumferential disc osteophyte complex. Broad-based posterior component eccentric to the left. Superimposed mild facet hypertrophy and degenerative facet joint fluid. No spinal stenosis, but at least moderate left lateral recess stenosis (descending left L5 nerve level series 6, image 31). Mild bilateral L4 foraminal stenosis. L5-S1: Disc desiccation with mild disc bulging and subtle cephalad disc extrusion in the midline best seen on series 3, image 7. Mild facet hypertrophy and degenerative facet joint fluid. No spinal or lateral recess stenosis. Mild if any L5 foraminal stenosis. IMPRESSION: 1. Chronic disc and endplate degeneration at L4-L5 eccentric to the left with at least moderate left lateral recess stenosis. Query Left L5 radiculitis. 2. Small disc herniations at both T12-L1 and L5-S1 with no significant stenosis. Electronically Signed   By: Genevie Ann M.D.   On: 02/08/2022 07:10     Assessment & Plan:   No problem-specific Assessment & Plan notes found for this encounter.     Martyn Ehrich, NP 02/14/2022

## 2022-02-15 ENCOUNTER — Ambulatory Visit (INDEPENDENT_AMBULATORY_CARE_PROVIDER_SITE_OTHER): Payer: 59 | Admitting: Primary Care

## 2022-02-15 ENCOUNTER — Encounter: Payer: Self-pay | Admitting: Primary Care

## 2022-02-15 VITALS — BP 122/78 | HR 68 | Temp 98.3°F | Ht 64.0 in | Wt 226.0 lb

## 2022-02-15 DIAGNOSIS — G4733 Obstructive sleep apnea (adult) (pediatric): Secondary | ICD-10-CM

## 2022-02-15 DIAGNOSIS — Z9989 Dependence on other enabling machines and devices: Secondary | ICD-10-CM | POA: Diagnosis not present

## 2022-02-15 DIAGNOSIS — Z8669 Personal history of other diseases of the nervous system and sense organs: Secondary | ICD-10-CM | POA: Insufficient documentation

## 2022-02-15 NOTE — Assessment & Plan Note (Signed)
-   HX OSA, dx in 2012 in [place]. Currently on CPAP but having breakthrough symptoms. She is having symptoms of daytime sleepiness and restless sleep. Her weight is up 75 lbs in the last two years. Since sleep study is > 52 years old and we do not have a copy of this she needs repeat study, recommend in-lab split night to assess for OSA and determine best pressure settings. FU in 6 weeks or sooner if needed to review sleep study results and treatment options.

## 2022-02-15 NOTE — Patient Instructions (Addendum)
Sleep apnea is defined as period of 10 seconds or longer when you stop breathing at night. This can happen multiple times a night. Dx sleep apnea is when this occurs more than 5 times an hour.    Mild OSA 5-15 apneic events an hour Moderate OSA 15-30 apneic events an hour Severe OSA > 30 apneic events an hour   Untreated sleep apnea puts you at higher risk for cardiac arrhythmias, pulmonary HTN, stroke and diabetes   Treatment options include weight loss, side sleeping position, oral appliance, CPAP therapy or referral to ENT for possible surgical options    Recommendations: Focus on side sleeping position Work on weight loss efforts  Do not drive if experiencing excessive daytime sleepiness of fatigue    Orders: Split night sleep study re: OSA    Follow-up: 6 week follow-up in office to review sleep study results and treatment if needed    CPAP and BIPAP Information CPAP and BIPAP are methods that use air pressure to keep your airways open and to help you breathe well. CPAP and BIPAP use different amounts of pressure. Your health care provider will tell you whether CPAP or BIPAP would be more helpful for you. CPAP stands for "continuous positive airway pressure." With CPAP, the amount of pressure stays the same while you breathe in (inhale) and out (exhale). BIPAP stands for "bi-level positive airway pressure." With BIPAP, the amount of pressure will be higher when you inhale and lower when you exhale. This allows you to take larger breaths. CPAP or BIPAP may be used in the hospital, or your health care provider may want you to use it at home. You may need to have a sleep study before your health care provider can order a machine for you to use at home. What are the advantages? CPAP or BIPAP can be helpful if you have: Sleep apnea. Chronic obstructive pulmonary disease (COPD). Heart failure. Medical conditions that cause muscle weakness, including muscular dystrophy or amyotrophic  lateral sclerosis (ALS). Other problems that cause breathing to be shallow, weak, abnormal, or difficult. CPAP and BIPAP are most commonly used for obstructive sleep apnea (OSA) to keep the airways from collapsing when the muscles relax during sleep. What are the risks? Generally, this is a safe treatment. However, problems may occur, including: Irritated skin or skin sores if the mask does not fit properly. Dry or stuffy nose or nosebleeds. Dry mouth. Feeling gassy or bloated. Sinus or lung infection if the equipment is not cleaned properly. When should CPAP or BIPAP be used? In most cases, the mask only needs to be worn during sleep. Generally, the mask needs to be worn throughout the night and during any daytime naps. People with certain medical conditions may also need to wear the mask at other times, such as when they are awake. Follow instructions from your health care provider about when to use the machine. What happens during CPAP or BIPAP?  Both CPAP and BIPAP are provided by a small machine with a flexible plastic tube that attaches to a plastic mask that you wear. Air is blown through the mask into your nose or mouth. The amount of pressure that is used to blow the air can be adjusted on the machine. Your health care provider will set the pressure setting and help you find the best mask for you. Tips for using the mask Because the mask needs to be snug, some people feel trapped or closed-in (claustrophobic) when first using the mask. If  you feel this way, you may need to get used to the mask. One way to do this is to hold the mask loosely over your nose or mouth and then gradually apply the mask more snugly. You can also gradually increase the amount of time that you use the mask. Masks are available in various types and sizes. If your mask does not fit well, talk with your health care provider about getting a different one. Some common types of masks include: Full face masks, which fit  over the mouth and nose. Nasal masks, which fit over the nose. Nasal pillow or prong masks, which fit into the nostrils. If you are using a mask that fits over your nose and you tend to breathe through your mouth, a chin strap may be applied to help keep your mouth closed. Use a skin barrier to protect your skin as told by your health care provider. Some CPAP and BIPAP machines have alarms that may sound if the mask comes off or develops a leak. If you have trouble with the mask, it is very important that you talk with your health care provider about finding a way to make the mask easier to tolerate. Do not stop using the mask. There could be a negative impact on your health if you stop using the mask. Tips for using the machine Place your CPAP or BIPAP machine on a secure table or stand near an electrical outlet. Know where the on/off switch is on the machine. Follow instructions from your health care provider about how to set the pressure on your machine and when you should use it. Do not eat or drink while the CPAP or BIPAP machine is on. Food or fluids could get pushed into your lungs by the pressure of the CPAP or BIPAP. For home use, CPAP and BIPAP machines can be rented or purchased through home health care companies. Many different brands of machines are available. Renting a machine before purchasing may help you find out which particular machine works well for you. Your health insurance company may also decide which machine you may get. Keep the CPAP or BIPAP machine and attachments clean. Ask your health care provider for specific instructions. Check the humidifier if you have a dry stuffy nose or nosebleeds. Make sure it is working correctly. Follow these instructions at home: Take over-the-counter and prescription medicines only as told by your health care provider. Ask if you can take sinus medicine if your sinuses are blocked. Do not use any products that contain nicotine or tobacco.  These products include cigarettes, chewing tobacco, and vaping devices, such as e-cigarettes. If you need help quitting, ask your health care provider. Keep all follow-up visits. This is important. Contact a health care provider if: You have redness or pressure sores on your head, face, mouth, or nose from the mask or head gear. You have trouble using the CPAP or BIPAP machine. You cannot tolerate wearing the CPAP or BIPAP mask. Someone tells you that you snore even when wearing your CPAP or BIPAP. Get help right away if: You have trouble breathing. You feel confused. Summary CPAP and BIPAP are methods that use air pressure to keep your airways open and to help you breathe well. If you have trouble with the mask, it is very important that you talk with your health care provider about finding a way to make the mask easier to tolerate. Do not stop using the mask. There could be a negative impact to your  health if you stop using the mask. Follow instructions from your health care provider about when to use the machine. This information is not intended to replace advice given to you by your health care provider. Make sure you discuss any questions you have with your health care provider. Document Revised: 04/21/2021 Document Reviewed: 08/21/2020 Elsevier Patient Education  [place].

## 2022-02-23 NOTE — Progress Notes (Signed)
Reviewed and agree with assessment/plan.   Chesley Mires, MD [facility] Pulmonary/Critical Care 02/23/2022, 12:48 PM Pager:  [phone]

## 2022-03-17 ENCOUNTER — Ambulatory Visit (HOSPITAL_BASED_OUTPATIENT_CLINIC_OR_DEPARTMENT_OTHER): Payer: 59 | Attending: Primary Care | Admitting: Pulmonary Disease

## 2022-03-17 DIAGNOSIS — G4763 Sleep related bruxism: Secondary | ICD-10-CM | POA: Insufficient documentation

## 2022-03-17 DIAGNOSIS — G4733 Obstructive sleep apnea (adult) (pediatric): Secondary | ICD-10-CM | POA: Diagnosis present

## 2022-03-17 DIAGNOSIS — R0683 Snoring: Secondary | ICD-10-CM | POA: Diagnosis not present

## 2022-03-20 ENCOUNTER — Other Ambulatory Visit: Payer: Self-pay | Admitting: Physical Medicine and Rehabilitation

## 2022-03-21 ENCOUNTER — Encounter: Payer: Self-pay | Admitting: Physical Medicine and Rehabilitation

## 2022-03-21 ENCOUNTER — Ambulatory Visit (INDEPENDENT_AMBULATORY_CARE_PROVIDER_SITE_OTHER): Payer: 59 | Admitting: Physical Medicine and Rehabilitation

## 2022-03-21 ENCOUNTER — Ambulatory Visit: Payer: Self-pay

## 2022-03-21 DIAGNOSIS — M5416 Radiculopathy, lumbar region: Secondary | ICD-10-CM

## 2022-03-21 MED ORDER — METHYLPREDNISOLONE ACETATE 80 MG/ML IJ SUSP
80.0000 mg | Freq: Once | INTRAMUSCULAR | Status: AC
  Administered 2022-03-21: 80 mg

## 2022-03-21 NOTE — Progress Notes (Signed)
Pt state lower back pain that travels down both legs. Pt state walking, standing and laying down makes the pain worse. Pt state the pain makes nausea at times. Pt state she takes pain meds and uses heat to help ease her pain.  Numeric Pain Rating Scale and Functional Assessment Average Pain 8   In the last MONTH (on 0-10 scale) has pain interfered with the following?  1. General activity like being  able to carry out your everyday physical activities such as walking, climbing stairs, carrying groceries, or moving a chair?  Rating(10)   +Driver, -BT, -Dye Allergies.

## 2022-03-23 DIAGNOSIS — Z9989 Dependence on other enabling machines and devices: Secondary | ICD-10-CM

## 2022-03-23 DIAGNOSIS — G4733 Obstructive sleep apnea (adult) (pediatric): Secondary | ICD-10-CM | POA: Diagnosis not present

## 2022-03-23 NOTE — Procedures (Signed)
     Patient Name: Sydney Marshall, Sydney Marshall Date: 03/17/2022 Gender: Female D.O.B: August 29, 1971 Age (years): 41 Referring Provider: Geraldo Pitter NP Height (inches): 64 Interpreting Physician: Chesley Mires MD, ABSM Weight (lbs): 226 RPSGT: Laren Everts BMI: 72 MRN: [id number] Neck Size: 14.00  CLINICAL INFORMATION Sleep Study Type: NPSG  Indication for sleep study: Fatigue, Morning Headaches, Obesity, OSA, Sleep walking/talking/parasomnias, Snoring  Epworth Sleepiness Score: 4  SLEEP STUDY TECHNIQUE As per the AASM Manual for the Scoring of Sleep and Associated Events v2.3 (April 2016) with a hypopnea requiring 4% desaturations.  The channels recorded and monitored were frontal, central and occipital EEG, electrooculogram (EOG), submentalis EMG (chin), nasal and oral airflow, thoracic and abdominal wall motion, anterior tibialis EMG, snore microphone, electrocardiogram, and pulse oximetry.  MEDICATIONS Medications self-administered by patient taken the night of the study : N/A  SLEEP ARCHITECTURE The study was initiated at 11:15:15 PM and ended at 5:16:29 AM.  Sleep onset time was 30.0 minutes and the sleep efficiency was 68.7%%. The total sleep time was 248 minutes.  Stage REM latency was 58.0 minutes.  The patient spent 6.5%% of the night in stage N1 sleep, 60.3%% in stage N2 sleep, 10.7%% in stage N3 and 22.6% in REM.  Alpha intrusion was absent.  Supine sleep was 43.95%.  RESPIRATORY PARAMETERS The overall apnea/hypopnea index (AHI) was 4.1 per hour. There were 3 total apneas, including 0 obstructive, 3 central and 0 mixed apneas. There were 14 hypopneas and 14 RERAs.  The AHI during Stage REM sleep was 15.0 per hour.  AHI while supine was 6.1 per hour.  The mean oxygen saturation was 94.2%. The minimum SpO2 during sleep was 88.0%.  soft snoring was noted during this study.  CARDIAC DATA The 2 lead EKG demonstrated sinus rhythm. The mean heart rate was  65.6 beats per minute. Other EKG findings include: None.  LEG MOVEMENT DATA The total PLMS were 0 with a resulting PLMS index of 0.0. Associated arousal with leg movement index was 1.2 .  IMPRESSIONS - While she had several obstructive respiratory events these were not frequent enough to qualify a diagnosis of obstructive sleep apnea.  Her overall AHI was 4.1 with an SpO2 low of 88%. - She had one episode of bruxism. - Soft snoring was noted.  DIAGNOSIS - Snoring. - Bruxism.  RECOMMENDATIONS - Consider oral bite guard for bruxism - Avoid alcohol, sedatives and other CNS depressants that may worsen sleep apnea and disrupt normal sleep architecture. - Sleep hygiene should be reviewed to assess factors that may improve sleep quality. - Weight management and regular exercise should be initiated or continued if appropriate.  [Electronically signed] 03/23/2022 03:18 PM  Chesley Mires MD, ABSM Diplomate, American Board of Sleep Medicine NPI: [id number]  [place] PH: [phone]   FX: [phone] [place]

## 2022-04-04 ENCOUNTER — Ambulatory Visit: Payer: 59 | Admitting: Primary Care

## 2022-04-04 ENCOUNTER — Encounter: Payer: Self-pay | Admitting: Primary Care

## 2022-04-04 VITALS — BP 112/84 | HR 87 | Ht 64.0 in | Wt 220.5 lb

## 2022-04-04 DIAGNOSIS — Z8669 Personal history of other diseases of the nervous system and sense organs: Secondary | ICD-10-CM

## 2022-04-04 DIAGNOSIS — G4700 Insomnia, unspecified: Secondary | ICD-10-CM | POA: Diagnosis not present

## 2022-04-04 DIAGNOSIS — R0683 Snoring: Secondary | ICD-10-CM | POA: Diagnosis not present

## 2022-04-04 DIAGNOSIS — Z6836 Body mass index (BMI) 36.0-36.9, adult: Secondary | ICD-10-CM

## 2022-04-04 DIAGNOSIS — R058 Other specified cough: Secondary | ICD-10-CM

## 2022-04-04 DIAGNOSIS — E669 Obesity, unspecified: Secondary | ICD-10-CM

## 2022-04-04 NOTE — Progress Notes (Signed)
Reviewed and agree with assessment/plan.   Chesley Mires, MD [facility] Pulmonary/Critical Care 04/04/2022, 12:32 PM Pager:  [phone]

## 2022-04-04 NOTE — Progress Notes (Signed)
$'@Patient'r$  ID: Sydney Marshall, female    DOB: Feb 16, 1971, 51 y.o.   MRN: [id number]  Chief Complaint  Patient presents with   Follow-up    Pt 6wk f/u, not wearing CPAP. Pt does wake up gasping w/ morning headaches and constant tiredness. She reports coughing at night w/ SOB    Referring provider: Terrilyn Saver, NP  HPI: 51 year old female, never smoked. PMH significant for lumbar radiculopathy, cervical strain, herpes, sleep apnea.   Previous [facility] pulmonary encounter: 02/15/2022 Patient presents today for sleep consult. Accompanied by translator but speaks [place] well. She has hx sleep apnea which was dx in 2012 in [place]. We do not have sleep study results on file. Currently on CPAP, unsure pressure setting but thinks it may be 4.5 cm h20. No data available/SD card. Feels cpap is not working. She is always tired and wakes up frequently at night. She does not wear oxygen at night. Denies symptoms of narcolepsy, cataplexy and sleep walking.   Sleep questionnaire Symptoms-HX OSA; she is always tired without energy, restless sleep  Prior sleep study- 2012 in [place] Bedtime- 10:30pm-12am Time to fall asleep- 1-3 hours  Nocturnal awakenings- a lot Out of bed/start of day- 5-7am  Weight changes- up 75 lbs Do you operate heavy machinery- no Do you currently wear CPAP- yes Do you current wear oxygen- No Epworth- 3  04/04/2022 Patient presents today to follow-up on sleep study results.  She had a split-night sleep study on 03/17/2022 that showed several obstructive respiratory events however these were not frequent enough to qualify for diagnosis of obstructive sleep apnea.  Her overall AHI was 1.4 with SPO2 low 88%.  She had 1 episode of bruxism and soft snoring.  She has trouble sleeping. She had taken gabapentin in the past for neuropathy. She does not want to take medication to help her sleep.  She wakes up coughing at night. She is motivated to work on weight loss.   Allergies   Allergen Reactions   Lactose Intolerance (Gi) Diarrhea and Nausea And Vomiting    Immunization History  Administered Date(s) Administered   Influenza,inj,Quad PF,6+ Mos 07/06/2021   PFIZER(Purple Top)SARS-COV-2 Vaccination 05/17/2020   Tdap 01/19/2021    Past Medical History:  Diagnosis Date   Allergy    Anemia    Anxiety    Cancer ([place])    Depression    Sleep apnea    Vaginal Pap smear, abnormal     Tobacco History: Social History   Tobacco Use  Smoking Status Never  Smokeless Tobacco Never   Counseling given: Not Answered   Outpatient Medications Prior to Visit  Medication Sig Dispense Refill   diazepam (VALIUM) 5 MG tablet Take one tablet by mouth with food one hour prior to procedure. May repeat 30 minutes prior if needed. 2 tablet 0   Semaglutide-Weight Management (WEGOVY) 0.25 MG/0.5ML SOAJ Inject 0.25 mg into the skin once a week. After 4 weeks, can increase to 0.5 mg weekly for 4 weeks, then 1 mg weekly for 4 weeks 2 mL 3   valACYclovir (VALTREX) 1000 MG tablet Take 0.5 tablets (500 mg total) by mouth daily. 90 tablet 1   Facility-Administered Medications Prior to Visit  Medication Dose Route Frequency Provider Last Rate Last Admin   methylPREDNISolone acetate (DEPO-MEDROL) injection 80 mg  80 mg Other Once Magnus Sinning, MD       Review of Systems  Review of Systems  Constitutional: Negative.   Respiratory: Negative.  Psychiatric/Behavioral:  Positive for sleep disturbance.    Physical Exam  BP 112/84 (BP Location: Left Arm, Patient Position: Sitting, Cuff Size: Large)   Pulse 87   Ht '5\' 4"'$  (1.626 m)   Wt 220 lb 8 oz (100 kg)   SpO2 97%   BMI 37.85 kg/m  Physical Exam Constitutional:      Appearance: Normal appearance.  HENT:     Head: Normocephalic and atraumatic.     Mouth/Throat:     Mouth: Mucous membranes are moist.     Pharynx: Oropharynx is clear.  Cardiovascular:     Rate and Rhythm: Normal rate and regular rhythm.   Pulmonary:     Effort: Pulmonary effort is normal.     Breath sounds: Normal breath sounds.  Musculoskeletal:        General: Normal range of motion.     Cervical back: Normal range of motion and neck supple.  Skin:    General: Skin is warm and dry.  Neurological:     General: No focal deficit present.     Mental Status: She is alert and oriented to person, place, and time. Mental status is at baseline.  Psychiatric:        Mood and Affect: Mood normal.        Behavior: Behavior normal.        Thought Content: Thought content normal.        Judgment: Judgment normal.      Lab Results:  CBC    Component Value Date/Time   WBC 5.9 01/24/2022 1510   RBC 4.53 01/24/2022 1510   HGB 13.4 01/24/2022 1510   HCT 40.2 01/24/2022 1510   PLT 265.0 01/24/2022 1510   MCV 88.8 01/24/2022 1510   MCHC 33.2 01/24/2022 1510   RDW 14.7 01/24/2022 1510    BMET    Component Value Date/Time   NA 139 01/24/2022 1510   K 4.9 01/24/2022 1510   CL 104 01/24/2022 1510   CO2 27 01/24/2022 1510   GLUCOSE 82 01/24/2022 1510   BUN 23 01/24/2022 1510   CREATININE 0.74 01/24/2022 1510   CALCIUM 9.0 01/24/2022 1510    BNP No results found for: "BNP"  ProBNP No results found for: "PROBNP"  Imaging: SLEEP STUDY DOCUMENTS  Result Date: 03/22/2022 Ordered by an unspecified provider.  XR C-ARM NO REPORT  Result Date: 03/21/2022 Please see Notes tab for imaging impression.    Assessment & Plan:   History of sleep apnea - Split-night sleep study on 03/17/2022 >> AHI 4.1 with SPO2 low 88%.  1 episode of bruxism and soft snoring was noted.  No longer requires CPAP.  Referring to orthodontics for oral appliance.  Insomnia - Patient is not interested in medication use.  Reinforced sleep hygiene.   Nocturnal cough - Likely due to reflux, recommend trying over the counter famotidine '20mg'$  at bedtime. Work on weight loss. If cough persists may want to check pulmonary function testing.     Martyn Ehrich, NP 04/04/2022

## 2022-04-04 NOTE — Assessment & Plan Note (Addendum)
-   Patient is not interested in medication use.  Reinforced sleep hygiene.

## 2022-04-04 NOTE — Patient Instructions (Addendum)
Sleep study showed several obstructive events but not frequent enough to qualify for a diagnosis of obstructive sleep apnea  Recommendations: Focus on side sleeping position (avoid laying on your back) Keep established bedtime and waketime  Cool/dark environment  Avoid blue lights/ TV 1-2 hours before bedtime Get regular exercise and work on weight loss Try camomile or sleepy time tea   Referral: Orthodontics- re: snoring/bruxism  Healthy weight and wellness re: obesity   Follow-up: As needed if sleep worsens

## 2022-04-04 NOTE — Assessment & Plan Note (Signed)
-   Likely due to reflux, recommend trying over the counter famotidine '20mg'$  at bedtime. Work on weight loss. If cough persists may want to check pulmonary function testing.

## 2022-04-04 NOTE — Assessment & Plan Note (Signed)
-   Split-night sleep study on 03/17/2022 >> AHI 4.1 with SPO2 low 88%.  1 episode of bruxism and soft snoring was noted.  No longer requires CPAP.  Referring to orthodontics for oral appliance.

## 2022-04-11 ENCOUNTER — Ambulatory Visit: Payer: 59 | Admitting: Physical Medicine and Rehabilitation

## 2022-04-14 ENCOUNTER — Ambulatory Visit: Payer: 59 | Admitting: Physical Medicine and Rehabilitation

## 2022-04-14 ENCOUNTER — Encounter: Payer: Self-pay | Admitting: Physical Medicine and Rehabilitation

## 2022-04-14 VITALS — BP 126/85 | HR 55

## 2022-04-14 DIAGNOSIS — M542 Cervicalgia: Secondary | ICD-10-CM

## 2022-04-14 DIAGNOSIS — M5416 Radiculopathy, lumbar region: Secondary | ICD-10-CM | POA: Diagnosis not present

## 2022-04-14 DIAGNOSIS — M4726 Other spondylosis with radiculopathy, lumbar region: Secondary | ICD-10-CM

## 2022-04-14 DIAGNOSIS — M47816 Spondylosis without myelopathy or radiculopathy, lumbar region: Secondary | ICD-10-CM | POA: Diagnosis not present

## 2022-04-14 DIAGNOSIS — M48061 Spinal stenosis, lumbar region without neurogenic claudication: Secondary | ICD-10-CM | POA: Diagnosis not present

## 2022-04-14 NOTE — Progress Notes (Signed)
Pt state lower back pain and weakness that travels down both legs.Pt state it hard for her to get off the floor she need something to help her. Pt state walking, standing and laying down makes the pain worse. Pt state she takes pain meds and uses heat to help ease her pain. Pt has hx of inj on 03/21/22 pt state it didn't help at all.  Pt state neck pain that travels to her left shoulder.   Numeric Pain Rating Scale and Functional Assessment Average Pain 10 Pain Right Now 9 My pain is constant, sharp, burning, stabbing, tingling, and aching Pain is worse with: walking, bending, sitting, standing, some activites, and laying down Pain improves with: heat/ice, medication, and injections   In the last MONTH (on 0-10 scale) has pain interfered with the following?  1. General activity like being  able to carry out your everyday physical activities such as walking, climbing stairs, carrying groceries, or moving a chair?  Rating(5)  2. Relation with others like being able to carry out your usual social activities and roles such as  activities at home, at work and in your community. Rating(6)  3. Enjoyment of life such that you have  been bothered by emotional problems such as feeling anxious, depressed or irritable?  Rating(7)
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
## 2022-04-15 ENCOUNTER — Telehealth: Payer: Self-pay | Admitting: Orthopaedic Surgery

## 2022-04-15 NOTE — Telephone Encounter (Signed)
Patient is a Designer, fashion/clothing Patient and she no longer would like to see Ernestina Patches or Jinny Blossom, patients son states his mother was seen yesterday by Barnet Pall and apparently her bedside manner and attitude was not very well and she made the patient cry, patient is no longer comfortable seeing her and is requesting a new doctor for her neck and back is open to seeing any Doctor in the clinic. Please advise

## 2022-04-15 NOTE — Telephone Encounter (Signed)
Tried calling to discuss. LM for patient to call me to further discuss.

## 2022-04-19 ENCOUNTER — Encounter: Payer: Self-pay | Admitting: Family Medicine

## 2022-04-19 ENCOUNTER — Ambulatory Visit (INDEPENDENT_AMBULATORY_CARE_PROVIDER_SITE_OTHER): Payer: 59 | Admitting: Family Medicine

## 2022-04-19 VITALS — BP 122/84 | HR 72 | Temp 97.8°F | Resp 18 | Ht 64.0 in | Wt 220.6 lb

## 2022-04-19 DIAGNOSIS — A6004 Herpesviral vulvovaginitis: Secondary | ICD-10-CM

## 2022-04-19 DIAGNOSIS — M5416 Radiculopathy, lumbar region: Secondary | ICD-10-CM

## 2022-04-19 DIAGNOSIS — G8929 Other chronic pain: Secondary | ICD-10-CM | POA: Diagnosis not present

## 2022-04-19 DIAGNOSIS — M542 Cervicalgia: Secondary | ICD-10-CM | POA: Diagnosis not present

## 2022-04-19 MED ORDER — VALACYCLOVIR HCL 1 G PO TABS: 500.0000 mg | ORAL_TABLET | Freq: Every day | ORAL | 1 refills | Status: DC
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
## 2022-04-19 NOTE — Procedures (Signed)
Lumbar Epidural Steroid Injection - Interlaminar Approach with Fluoroscopic Guidance  Patient: Sydney Marshall      Date of Birth: July 07, 1971 MRN: [id number] PCP: Terrilyn Saver, NP      Visit Date: 03/21/2022   Universal Protocol:     Consent Given By: the patient  Position: PRONE  Additional Comments: Vital signs were monitored before and after the procedure. Patient was prepped and draped in the usual sterile fashion. The correct patient, procedure, and site was verified.   Injection Procedure Details:   Procedure diagnoses: Lumbar radiculopathy [M54.16]   Meds Administered:  Meds ordered this encounter  Medications   methylPREDNISolone acetate (DEPO-MEDROL) injection 80 mg     Laterality: Midline  Location/Site:  L4-5  Needle: 3.5 in., 20 ga. Tuohy  Needle Placement: Paramedian epidural  Findings:   -Comments: Excellent flow of contrast into the epidural space.  Procedure Details: Using a paramedian approach from the side mentioned above, the region overlying the inferior lamina was localized under fluoroscopic visualization and the soft tissues overlying this structure were infiltrated with 4 ml. of 1% Lidocaine without Epinephrine. The Tuohy needle was inserted into the epidural space using a paramedian approach.   The epidural space was localized using loss of resistance along with counter oblique bi-planar fluoroscopic views.  After negative aspirate for air, blood, and CSF, a 2 ml. volume of Isovue-250 was injected into the epidural space and the flow of contrast was observed. Radiographs were obtained for documentation purposes.    The injectate was administered into the level noted above.   Additional Comments:  No complications occurred Dressing: 2 x 2 sterile gauze and Band-Aid    Post-procedure details: Patient was observed during the procedure. Post-procedure instructions were reviewed.  Patient left the clinic in stable condition.

## 2022-04-19 NOTE — Progress Notes (Signed)
Sydney Marshall - 51 y.o. female MRN [id number]  Date of birth: May 19, 1971  Office Visit Note: Visit Date: 03/21/2022 PCP: Terrilyn Saver, NP Referred by: Terrilyn Saver, NP  Subjective: Chief Complaint  Patient presents with   Lower Back - Pain   Right Leg - Pain   Left Leg - Pain   HPI:  Sydney Marshall is a 51 y.o. female who comes in today at the request of Barnet Pall, FNP for planned Midline L4-5 Lumbar Interlaminar epidural steroid injection with fluoroscopic guidance.  The patient has failed conservative care including home exercise, medications, time and activity modification.  This injection will be diagnostic and hopefully therapeutic.  Please see requesting physician notes for further details and justification.   ROS Otherwise per HPI.  Assessment & Plan: Visit Diagnoses:    ICD-10-CM   1. Lumbar radiculopathy  M54.16 XR C-ARM NO REPORT    Epidural Steroid injection    methylPREDNISolone acetate (DEPO-MEDROL) injection 80 mg      Plan: No additional findings.   Meds & Orders:  Meds ordered this encounter  Medications   methylPREDNISolone acetate (DEPO-MEDROL) injection 80 mg    Orders Placed This Encounter  Procedures   XR C-ARM NO REPORT   Epidural Steroid injection    Follow-up: Return in about 2 weeks (around 04/04/2022) for visit to requesting provider as needed.   Procedures: No procedures performed  Lumbar Epidural Steroid Injection - Interlaminar Approach with Fluoroscopic Guidance  Patient: Sydney Marshall      Date of Birth: Feb 06, 1971 MRN: [id number] PCP: Terrilyn Saver, NP      Visit Date: 03/21/2022   Universal Protocol:     Consent Given By: the patient  Position: PRONE  Additional Comments: Vital signs were monitored before and after the procedure. Patient was prepped and draped in the usual sterile fashion. The correct patient, procedure, and site was verified.   Injection Procedure Details:   Procedure diagnoses: Lumbar  radiculopathy [M54.16]   Meds Administered:  Meds ordered this encounter  Medications   methylPREDNISolone acetate (DEPO-MEDROL) injection 80 mg     Laterality: Midline  Location/Site:  L4-5  Needle: 3.5 in., 20 ga. Tuohy  Needle Placement: Paramedian epidural  Findings:   -Comments: Excellent flow of contrast into the epidural space.  Procedure Details: Using a paramedian approach from the side mentioned above, the region overlying the inferior lamina was localized under fluoroscopic visualization and the soft tissues overlying this structure were infiltrated with 4 ml. of 1% Lidocaine without Epinephrine. The Tuohy needle was inserted into the epidural space using a paramedian approach.   The epidural space was localized using loss of resistance along with counter oblique bi-planar fluoroscopic views.  After negative aspirate for air, blood, and CSF, a 2 ml. volume of Isovue-250 was injected into the epidural space and the flow of contrast was observed. Radiographs were obtained for documentation purposes.    The injectate was administered into the level noted above.   Additional Comments:  No complications occurred Dressing: 2 x 2 sterile gauze and Band-Aid    Post-procedure details: Patient was observed during the procedure. Post-procedure instructions were reviewed.  Patient left the clinic in stable condition.   Clinical History: MRI LUMBAR SPINE WITHOUT CONTRAST   TECHNIQUE: Multiplanar, multisequence MR imaging of the lumbar spine was performed. No intravenous contrast was administered.   COMPARISON:  Chest radiographs 12/02/2021.   FINDINGS: Segmentation: Lumbar segmentation appears to be normal and will be designated as such  for this report.   Alignment: Straightening of lumbar lordosis with no significant spondylolisthesis or scoliosis.   Vertebrae: Chronic degenerative endplate marrow signal changes at L4-L5. No marrow edema or evidence of acute  osseous abnormality. Background bone marrow signal is within normal limits. Intact visible sacrum.   Conus medullaris and cauda equina: Conus extends to the T12-L1 level. No lower spinal cord or conus signal abnormality. In general the cauda equina nerve roots are normal.   Paraspinal and other soft tissues: Large body habitus but otherwise negative.   Disc levels:   Negative visible lower thoracic levels through T12-L1.   L1-L2: Mild disc desiccation and small cephalad central disc extrusion (series 6, image 9 and series 3, image 8) located just below the tip of the conus. No significant stenosis.   L2-L3:  Mild disc desiccation and subtle disc bulging.  No stenosis.   L3-L4: Negative disc. Mild facet and ligament flavum hypertrophy with trace degenerative facet joint fluid. No stenosis.   L4-L5: Disc desiccation and disc space loss, especially anteriorly. Somewhat circumferential disc osteophyte complex. Broad-based posterior component eccentric to the left. Superimposed mild facet hypertrophy and degenerative facet joint fluid. No spinal stenosis, but at least moderate left lateral recess stenosis (descending left L5 nerve level series 6, image 31). Mild bilateral L4 foraminal stenosis.   L5-S1: Disc desiccation with mild disc bulging and subtle cephalad disc extrusion in the midline best seen on series 3, image 7. Mild facet hypertrophy and degenerative facet joint fluid. No spinal or lateral recess stenosis. Mild if any L5 foraminal stenosis.   IMPRESSION: 1. Chronic disc and endplate degeneration at L4-L5 eccentric to the left with at least moderate left lateral recess stenosis. Query Left L5 radiculitis. 2. Small disc herniations at both T12-L1 and L5-S1 with no significant stenosis.     Electronically Signed   By: Genevie Ann M.D.   On: 02/08/2022 07:10     Objective:  VS:  HT:    WT:   BMI:     BP:   HR: bpm  TEMP: ( )  RESP:  Physical Exam Vitals and  nursing note reviewed.  Constitutional:      General: She is not in acute distress.    Appearance: Normal appearance. She is not ill-appearing.  HENT:     Head: Normocephalic and atraumatic.     Right Ear: External ear normal.     Left Ear: External ear normal.  Eyes:     Extraocular Movements: Extraocular movements intact.  Cardiovascular:     Rate and Rhythm: Normal rate.     Pulses: Normal pulses.  Pulmonary:     Effort: Pulmonary effort is normal. No respiratory distress.  Abdominal:     General: There is no distension.     Palpations: Abdomen is soft.  Musculoskeletal:        General: Tenderness present.     Cervical back: Neck supple.     Right lower leg: No edema.     Left lower leg: No edema.     Comments: Patient has good distal strength with no pain over the greater trochanters.  No clonus or focal weakness.  Skin:    Findings: No erythema, lesion or rash.  Neurological:     General: No focal deficit present.     Mental Status: She is alert and oriented to person, place, and time.     Sensory: No sensory deficit.     Motor: No weakness or abnormal muscle tone.  Coordination: Coordination normal.  Psychiatric:        Mood and Affect: Mood normal.        Behavior: Behavior normal.      Imaging: No results found.

## 2022-04-27 ENCOUNTER — Telehealth: Payer: Self-pay | Admitting: Family Medicine

## 2022-04-27 DIAGNOSIS — M5416 Radiculopathy, lumbar region: Secondary | ICD-10-CM

## 2022-04-27 NOTE — Telephone Encounter (Signed)
[facility] called stating the referral they received would not be able to be carried out as pt is on [employer] ins and they are out of network.

## 2022-04-27 NOTE — Telephone Encounter (Signed)
Patient called to follow up on referral. Advised her that [facility] is out of network for her insurance and a message was sent to our referral coordinator to send new referral. Patient would like a call to advise who the new referral will be with. She is in a lot of pain.

## 2022-04-27 NOTE — Telephone Encounter (Signed)
Patient called to follow up on referral but also wanted to know if meloxicam is good for inflammation. She is feeling a lot of pain and wants to know if this will help. Please call patient to advise.

## 2022-04-27 NOTE — Telephone Encounter (Signed)
Patient stated she did not know [employer] was no longer going to be active. She will get her insurance information in order and call back.

## 2022-04-28 ENCOUNTER — Encounter: Payer: Self-pay | Admitting: General Practice

## 2022-04-28 MED ORDER — MELOXICAM 15 MG PO TABS: 15.0000 mg | ORAL_TABLET | Freq: Every day | ORAL | 0 refills | Status: DC

## 2022-04-28 NOTE — Telephone Encounter (Signed)
Patient advised rx was sent.   This conversation was in [place].

## 2022-05-11 ENCOUNTER — Ambulatory Visit: Payer: 59 | Attending: Family Medicine | Admitting: Physical Therapy

## 2022-05-11 ENCOUNTER — Encounter: Payer: Self-pay | Admitting: Physical Therapy

## 2022-05-11 DIAGNOSIS — M6281 Muscle weakness (generalized): Secondary | ICD-10-CM | POA: Insufficient documentation

## 2022-05-11 DIAGNOSIS — M5416 Radiculopathy, lumbar region: Secondary | ICD-10-CM | POA: Diagnosis not present

## 2022-05-11 DIAGNOSIS — R252 Cramp and spasm: Secondary | ICD-10-CM | POA: Diagnosis present

## 2022-05-11 DIAGNOSIS — G8929 Other chronic pain: Secondary | ICD-10-CM | POA: Diagnosis not present

## 2022-05-11 DIAGNOSIS — M5441 Lumbago with sciatica, right side: Secondary | ICD-10-CM | POA: Diagnosis present

## 2022-05-11 DIAGNOSIS — R293 Abnormal posture: Secondary | ICD-10-CM | POA: Diagnosis present

## 2022-05-11 DIAGNOSIS — M5442 Lumbago with sciatica, left side: Secondary | ICD-10-CM | POA: Diagnosis present

## 2022-05-11 DIAGNOSIS — M546 Pain in thoracic spine: Secondary | ICD-10-CM | POA: Insufficient documentation

## 2022-05-11 DIAGNOSIS — M542 Cervicalgia: Secondary | ICD-10-CM | POA: Diagnosis present
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
## 2022-05-15 ENCOUNTER — Emergency Department (HOSPITAL_BASED_OUTPATIENT_CLINIC_OR_DEPARTMENT_OTHER): Payer: 59

## 2022-05-15 ENCOUNTER — Other Ambulatory Visit: Payer: Self-pay

## 2022-05-15 ENCOUNTER — Encounter (HOSPITAL_BASED_OUTPATIENT_CLINIC_OR_DEPARTMENT_OTHER): Payer: Self-pay | Admitting: Emergency Medicine

## 2022-05-15 ENCOUNTER — Emergency Department (HOSPITAL_BASED_OUTPATIENT_CLINIC_OR_DEPARTMENT_OTHER)
Admission: EM | Admit: 2022-05-15 | Discharge: 2022-05-15 | Disposition: A | Discharge status: Home / Self Care | Payer: 59 | Attending: Emergency Medicine | Admitting: Emergency Medicine

## 2022-05-15 DIAGNOSIS — G43809 Other migraine, not intractable, without status migrainosus: Secondary | ICD-10-CM | POA: Insufficient documentation

## 2022-05-15 DIAGNOSIS — M62838 Other muscle spasm: Secondary | ICD-10-CM | POA: Insufficient documentation

## 2022-05-15 DIAGNOSIS — R519 Headache, unspecified: Secondary | ICD-10-CM | POA: Diagnosis present

## 2022-05-15 DIAGNOSIS — M542 Cervicalgia: Secondary | ICD-10-CM | POA: Diagnosis not present

## 2022-05-15 LAB — CBC WITH DIFFERENTIAL/PLATELET
Abs Immature Granulocytes: 0 10*3/uL (ref 0.00–0.07)
Basophils Absolute: 0 10*3/uL (ref 0.0–0.1)
Basophils Relative: 1 %
Eosinophils Absolute: 0.1 10*3/uL (ref 0.0–0.5)
Eosinophils Relative: 1 %
HCT: 42.7 % (ref 36.0–46.0)
Hemoglobin: 14.1 g/dL (ref 12.0–15.0)
Immature Granulocytes: 0 %
Lymphocytes Relative: 49 %
Lymphs Abs: 2.9 10*3/uL (ref 0.7–4.0)
MCH: 29.4 pg (ref 26.0–34.0)
MCHC: 33 g/dL (ref 30.0–36.0)
MCV: 89 fL (ref 80.0–100.0)
Monocytes Absolute: 0.4 10*3/uL (ref 0.1–1.0)
Monocytes Relative: 7 %
Neutro Abs: 2.5 10*3/uL (ref 1.7–7.7)
Neutrophils Relative %: 42 %
Platelets: 258 10*3/uL (ref 150–400)
RBC: 4.8 MIL/uL (ref 3.87–5.11)
RDW: 14.7 % (ref 11.5–15.5)
WBC: 5.9 10*3/uL (ref 4.0–10.5)
nRBC: 0 % (ref 0.0–0.2)

## 2022-05-15 LAB — BASIC METABOLIC PANEL
Anion gap: 8 (ref 5–15)
BUN: 17 mg/dL (ref 6–20)
CO2: 25 mmol/L (ref 22–32)
Calcium: 8.9 mg/dL (ref 8.9–10.3)
Chloride: 106 mmol/L (ref 98–111)
Creatinine, Ser: 0.62 mg/dL (ref 0.44–1.00)
GFR, Estimated: >60 mL/min (ref >60)
Glucose, Bld: 96 mg/dL (ref 70–99)
Potassium: 3.9 mmol/L (ref 3.5–5.1)
Sodium: 139 mmol/L (ref 135–145)

## 2022-05-15 MED ORDER — CYCLOBENZAPRINE HCL 10 MG PO TABS
10.0000 mg | ORAL_TABLET | Freq: Once | ORAL | Status: AC
  Administered 2022-05-15: 10 mg via ORAL
  Filled 2022-05-15: qty 1

## 2022-05-15 MED ORDER — CYCLOBENZAPRINE HCL 10 MG PO TABS: 10.0000 mg | ORAL_TABLET | Freq: Two times a day (BID) | ORAL | 0 refills | Status: DC | PRN

## 2022-05-15 MED ORDER — DIPHENHYDRAMINE HCL 50 MG/ML IJ SOLN
25.0000 mg | Freq: Once | INTRAMUSCULAR | Status: AC
  Administered 2022-05-15: 25 mg via INTRAVENOUS
  Filled 2022-05-15: qty 1

## 2022-05-15 MED ORDER — SODIUM CHLORIDE 0.9 % IV BOLUS
1000.0000 mL | Freq: Once | INTRAVENOUS | Status: AC
  Administered 2022-05-15: 1000 mL via INTRAVENOUS

## 2022-05-15 MED ORDER — PROCHLORPERAZINE EDISYLATE 10 MG/2ML IJ SOLN
10.0000 mg | Freq: Once | INTRAMUSCULAR | Status: AC
  Administered 2022-05-15: 10 mg via INTRAVENOUS
  Filled 2022-05-15: qty 2

## 2022-05-15 MED ORDER — DEXAMETHASONE SODIUM PHOSPHATE 10 MG/ML IJ SOLN
10.0000 mg | Freq: Once | INTRAMUSCULAR | Status: AC
  Administered 2022-05-15: 10 mg via INTRAVENOUS
  Filled 2022-05-15: qty 1

## 2022-05-15 MED ORDER — METHYLPREDNISOLONE 4 MG PO TBPK
ORAL_TABLET | ORAL | 0 refills | Status: DC
Start: 2022-05-15 — End: 2023-05-04
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
## 2022-05-15 NOTE — Discharge Instructions (Signed)
Start steroid Dosepak tomorrow

## 2022-05-16 ENCOUNTER — Ambulatory Visit: Payer: 59 | Admitting: Physical Therapy

## 2022-05-17 ENCOUNTER — Encounter: Payer: Self-pay | Admitting: Family Medicine

## 2022-05-17 ENCOUNTER — Ambulatory Visit (INDEPENDENT_AMBULATORY_CARE_PROVIDER_SITE_OTHER): Payer: 59 | Admitting: Family Medicine

## 2022-05-17 VITALS — BP 112/80 | HR 71 | Temp 98.8°F | Ht 64.0 in | Wt 222.2 lb

## 2022-05-17 DIAGNOSIS — G8929 Other chronic pain: Secondary | ICD-10-CM | POA: Diagnosis not present

## 2022-05-17 DIAGNOSIS — M545 Low back pain, unspecified: Secondary | ICD-10-CM | POA: Diagnosis not present

## 2022-05-17 DIAGNOSIS — R110 Nausea: Secondary | ICD-10-CM

## 2022-05-17 DIAGNOSIS — M5416 Radiculopathy, lumbar region: Secondary | ICD-10-CM | POA: Diagnosis not present

## 2022-05-17 MED ORDER — ONDANSETRON 4 MG PO TBDP: 4.0000 mg | ORAL_TABLET | Freq: Three times a day (TID) | ORAL | 0 refills | Status: DC | PRN

## 2022-05-17 MED ORDER — AMITRIPTYLINE HCL 10 MG PO TABS: 10.0000 mg | ORAL_TABLET | Freq: Every day | ORAL | 0 refills | Status: DC

## 2022-05-17 NOTE — Patient Instructions (Addendum)
Let's hold off on PT for now.   We will get the ball rolling with another neurosurgery group to evaluate your spine. If you do not hear anything about your referral in the next 1-2 weeks, call our office and ask for an update.  Heat (pad or rice pillow in microwave) over affected area, 10-15 minutes twice daily.   Ice/cold pack over area for 10-15 min twice daily.  Let us know if you need anything.
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
## 2022-05-23 ENCOUNTER — Ambulatory Visit: Payer: 59

## 2022-05-25 ENCOUNTER — Encounter: Payer: 59 | Admitting: Physical Therapy

## 2022-05-25 NOTE — Telephone Encounter (Signed)
Patient states she will have [facility]- as of sept 1st

## 2022-06-06 ENCOUNTER — Encounter: Payer: Self-pay | Admitting: Physical Therapy

## 2022-06-13 ENCOUNTER — Encounter: Payer: Self-pay | Admitting: Physical Therapy

## 2022-06-17 ENCOUNTER — Telehealth: Payer: Self-pay | Admitting: Family Medicine

## 2022-06-17 NOTE — Telephone Encounter (Signed)
Fax resent.

## 2022-06-17 NOTE — Telephone Encounter (Signed)
Patient called to advise [facility] did not receive referral for her which is why they have not called. Please resend referral.   New ins: [employer] [id number]

## 2022-06-22 ENCOUNTER — Other Ambulatory Visit: Payer: Self-pay | Admitting: Family Medicine

## 2022-06-22 DIAGNOSIS — M5416 Radiculopathy, lumbar region: Secondary | ICD-10-CM

## 2022-06-27 ENCOUNTER — Telehealth: Payer: Self-pay | Admitting: Family Medicine

## 2022-06-27 NOTE — Telephone Encounter (Signed)
[facility] states they do not take pt's ins.

## 2022-07-07 ENCOUNTER — Telehealth: Payer: Self-pay | Admitting: Family Medicine

## 2022-07-07 NOTE — Telephone Encounter (Signed)
Patient called to advise that she is still having neck and back pain and is nauseous and needs to be seen by another doctor quickly. Patient said [facility] does not accept her insurance because her insurance is [employer] with [facility]. Please refer patient to another provider within network.

## 2022-07-13 ENCOUNTER — Other Ambulatory Visit (HOSPITAL_BASED_OUTPATIENT_CLINIC_OR_DEPARTMENT_OTHER): Payer: Self-pay | Admitting: Family Medicine

## 2022-07-13 ENCOUNTER — Telehealth: Payer: Self-pay | Admitting: Family Medicine

## 2022-07-13 DIAGNOSIS — Z1231 Encounter for screening mammogram for malignant neoplasm of breast: Secondary | ICD-10-CM

## 2022-07-13 NOTE — Telephone Encounter (Signed)
Notes printed. Tried calling pt to notify her but there was no answer and her vm is full.

## 2022-07-13 NOTE — Telephone Encounter (Signed)
Patient states she has an appt with her neuro surgeon on Friday and they advised to come  in and pick up all of her office notes/scans done for her so she can bring them to her appointment. She would like a call when everything is ready so she can pick it up. Please advise.

## 2022-07-13 NOTE — Telephone Encounter (Signed)
Spoke to patient and gave her [facility] neurosurgery number.

## 2022-07-14 NOTE — Telephone Encounter (Signed)
Pt notified that notes are ready to pick up at front office.

## 2022-07-19 ENCOUNTER — Ambulatory Visit (HOSPITAL_BASED_OUTPATIENT_CLINIC_OR_DEPARTMENT_OTHER)
Admission: RE | Admit: 2022-07-19 | Discharge: 2022-07-19 | Disposition: A | Discharge status: Home / Self Care | Payer: BLUE CROSS/BLUE SHIELD | Source: Ambulatory Visit | Attending: Family Medicine | Admitting: Family Medicine

## 2022-07-19 ENCOUNTER — Inpatient Hospital Stay (HOSPITAL_BASED_OUTPATIENT_CLINIC_OR_DEPARTMENT_OTHER): Admission: RE | Admit: 2022-07-19 | Payer: BLUE CROSS/BLUE SHIELD | Source: Ambulatory Visit

## 2022-07-19 ENCOUNTER — Encounter (HOSPITAL_BASED_OUTPATIENT_CLINIC_OR_DEPARTMENT_OTHER): Payer: Self-pay

## 2022-07-19 DIAGNOSIS — Z1231 Encounter for screening mammogram for malignant neoplasm of breast: Secondary | ICD-10-CM | POA: Diagnosis present

## 2022-10-03 ENCOUNTER — Other Ambulatory Visit: Payer: Self-pay | Admitting: Family Medicine

## 2022-10-03 DIAGNOSIS — M5416 Radiculopathy, lumbar region: Secondary | ICD-10-CM

## 2023-01-09 ENCOUNTER — Encounter: Payer: Self-pay | Admitting: *Deleted

## 2023-01-20 ENCOUNTER — Other Ambulatory Visit: Payer: Self-pay | Admitting: Family Medicine

## 2023-01-20 DIAGNOSIS — M5416 Radiculopathy, lumbar region: Secondary | ICD-10-CM

## 2023-04-26 ENCOUNTER — Ambulatory Visit: Payer: BLUE CROSS/BLUE SHIELD | Admitting: Family Medicine

## 2023-04-27 ENCOUNTER — Encounter: Payer: Self-pay | Admitting: Physician Assistant

## 2023-04-27 ENCOUNTER — Ambulatory Visit: Payer: BLUE CROSS/BLUE SHIELD | Admitting: Physician Assistant

## 2023-04-27 VITALS — BP 118/80 | HR 66 | Temp 97.9°F | Resp 20 | Ht 60.5 in | Wt 225.0 lb

## 2023-04-27 DIAGNOSIS — R87610 Atypical squamous cells of undetermined significance on cytologic smear of cervix (ASC-US): Secondary | ICD-10-CM

## 2023-04-27 DIAGNOSIS — N644 Mastodynia: Secondary | ICD-10-CM | POA: Diagnosis not present

## 2023-04-27 DIAGNOSIS — R300 Dysuria: Secondary | ICD-10-CM

## 2023-04-27 DIAGNOSIS — R87810 Cervical high risk human papillomavirus (HPV) DNA test positive: Secondary | ICD-10-CM

## 2023-04-27 MED ORDER — PHENAZOPYRIDINE HCL 200 MG PO TABS: 200.0000 mg | ORAL_TABLET | Freq: Three times a day (TID) | ORAL | 0 refills | Status: DC | PRN

## 2023-04-27 NOTE — Progress Notes (Signed)
Established patient visit   Patient: Sydney Marshall   DOB: 01-02-1971   52 y.o. Female  MRN: [id number] Visit Date: 04/27/2023  Today's healthcare provider: Alfredia Ferguson, PA-C   Chief Complaint  Patient presents with   Breast Problem    Left breast burning and pain over 6 months   Subjective    HPI  Interpreter Reuel Boom #[id number]   Pt reports L breast burning x 6 mo periareolar down her surgery lines. H/o reduction and mastopexy. Reports bumps that have appeared along her surgery that are painful to touch. Denies discharge.   Reports a small similar lesion on her right breast as well.  Pt also reports dysuria w/ hematuria, lower abdominal pain x 1 mo. She has tried Azo, cranberry juice otc with no relief.  When asked about the history of cancer on her chart, pt reports uterine cancer. Reviewed records, h/o endometrial bx but b9. Ascus w/ + hpv but no cervical cancer.   Medications: Outpatient Medications Prior to Visit  Medication Sig   amitriptyline (ELAVIL) 10 MG tablet TAKE 1 TABLET(10 MG) BY MOUTH AT BEDTIME   cyclobenzaprine (FLEXERIL) 10 MG tablet Take 1 tablet (10 mg total) by mouth 2 (two) times daily as needed for muscle spasms.   meloxicam (MOBIC) 15 MG tablet TAKE 1 TABLET(15 MG) BY MOUTH DAILY   methylPREDNISolone (MEDROL DOSEPAK) 4 MG TBPK tablet Follow package insert   ondansetron (ZOFRAN-ODT) 4 MG disintegrating tablet Take 1 tablet (4 mg total) by mouth every 8 (eight) hours as needed for nausea or vomiting.   valACYclovir (VALTREX) 1000 MG tablet Take 0.5 tablets (500 mg total) by mouth daily.   No facility-administered medications prior to visit.      Objective    BP 118/80 (BP Location: Left Arm, Patient Position: Sitting, Cuff Size: Large)   Pulse 66   Temp 97.9 F (36.6 C) (Oral)   Resp 20   Ht 5' 0.5" (1.537 m)   Wt 225 lb (102.1 kg)   SpO2 98%   BMI 43.22 kg/m    Physical Exam Vitals reviewed.  Constitutional:      Appearance: She  is not ill-appearing.  HENT:     Head: Normocephalic.  Eyes:     Conjunctiva/sclera: Conjunctivae normal.  Cardiovascular:     Rate and Rhythm: Normal rate.  Pulmonary:     Effort: Pulmonary effort is normal. No respiratory distress.  Chest:     Comments: L breast with pain in b/l lower quadrants.  She has three small mobile masses, < 1-2 mm along surgery lines. No discharge appreciated. One , larger, 6:00 3-4 cfm is larger and dark in color.   R breast with a < 1 mm similar lesion 7:00 1-2 cmfn  No other masses, nipple inversion, skin changes, appreciated. B/l axillas normal, no lymphadenopathy Neurological:     General: No focal deficit present.     Mental Status: She is alert and oriented to person, place, and time.  Psychiatric:        Mood and Affect: Mood normal.        Behavior: Behavior normal.      Results for orders placed or performed in visit on 04/27/23  POCT urinalysis dipstick  Result Value Ref Range   Color, UA yellow yellow   Clarity, UA clear clear   Glucose, UA negative negative mg/dL   Bilirubin, UA negative negative   Ketones, POC UA negative negative mg/dL   Spec Grav, UA 0.865  1.010 - 1.025   Blood, UA negative negative   pH, UA 6.0 5.0 - 8.0   Protein Ur, POC negative negative mg/dL   Urobilinogen, UA 0.2 0.2 or 1.0 E.U./dL   Nitrite, UA Negative Negative   Leukocytes, UA Negative Negative    Assessment & Plan    1. Breast pain, left Recommending mammo/sono Lesions I appreciate as epidermal cysts. The large darker one, may ref to derm to bx given its appearance.  Will hold off until mammo/sono results to ensure no breast abnormalities.  - [place] LIMITED ULTRASOUND INCLUDING AXILLA LEFT BREAST ; Future - MM 3D DIAGNOSTIC MAMMOGRAM BILATERAL BREAST; Future  2. Dysuria UA negative ordered culture Rx pyridium to help with symptoms  - POCT urinalysis dipstick - Urine Culture - phenazopyridine (PYRIDIUM) 200 MG tablet; Take 1 tablet (200 mg  total) by mouth 3 (three) times daily as needed for pain.  Dispense: 10 tablet; Refill: 0  3. ASCUS with positive high risk HPV cervical - Ambulatory referral to Gynecology   Return if symptoms worsen or fail to improve.      I, Alfredia Ferguson, PA-C have reviewed all documentation for this visit. The documentation on  04/28/23   for the exam, diagnosis, procedures, and orders are all accurate and complete.    Alfredia Ferguson, PA-C  [facility] [phone] (phone) [phone] (fax)  [facility]

## 2023-05-04 ENCOUNTER — Encounter: Payer: Self-pay | Admitting: Obstetrics and Gynecology

## 2023-05-04 ENCOUNTER — Other Ambulatory Visit (HOSPITAL_COMMUNITY)
Admission: RE | Admit: 2023-05-04 | Discharge: 2023-05-04 | Disposition: A | Discharge status: Home / Self Care | Payer: BLUE CROSS/BLUE SHIELD | Source: Ambulatory Visit | Attending: Obstetrics and Gynecology | Admitting: Obstetrics and Gynecology

## 2023-05-04 ENCOUNTER — Ambulatory Visit (INDEPENDENT_AMBULATORY_CARE_PROVIDER_SITE_OTHER): Payer: BLUE CROSS/BLUE SHIELD | Admitting: Obstetrics and Gynecology

## 2023-05-04 ENCOUNTER — Ambulatory Visit: Payer: BLUE CROSS/BLUE SHIELD | Admitting: Family Medicine

## 2023-05-04 VITALS — BP 118/78 | HR 60 | Ht 64.0 in | Wt 226.0 lb

## 2023-05-04 DIAGNOSIS — N941 Unspecified dyspareunia: Secondary | ICD-10-CM

## 2023-05-04 DIAGNOSIS — Z87898 Personal history of other specified conditions: Secondary | ICD-10-CM

## 2023-05-04 DIAGNOSIS — Z1239 Encounter for other screening for malignant neoplasm of breast: Secondary | ICD-10-CM

## 2023-05-04 DIAGNOSIS — L292 Pruritus vulvae: Secondary | ICD-10-CM | POA: Diagnosis present

## 2023-05-04 DIAGNOSIS — B9689 Other specified bacterial agents as the cause of diseases classified elsewhere: Secondary | ICD-10-CM

## 2023-05-04 DIAGNOSIS — N760 Acute vaginitis: Secondary | ICD-10-CM | POA: Diagnosis not present

## 2023-05-04 DIAGNOSIS — B3731 Acute candidiasis of vulva and vagina: Secondary | ICD-10-CM

## 2023-05-04 MED ORDER — TRIAMCINOLONE ACETONIDE 0.025 % EX OINT: 1.0000 | TOPICAL_OINTMENT | Freq: Two times a day (BID) | CUTANEOUS | 0 refills | Status: AC

## 2023-05-04 NOTE — Progress Notes (Signed)
GYNECOLOGY OFFICE VISIT NOTE  History:   Sydney Marshall is a 52 y.o. G3P3 here today for vulvovaginal itching and burning.   Her symptoms started 1 month ago.  She notes pain and burning and itching in the vulvovaginal area.  She notes pain with intercourse.  She has been with the same partner for very long time.  She reports in 2021 she had some sort of testing that was abnormal. She is unsure if it was her uterus or her cervix.  When I reviewed her chart she did have an abnormal Pap smear.  She also had an endometrial biopsy which showed a polypectomy.  She had this removed it appears based on records.  She has since had a subsequent Pap smear and HPV testing which were both normal.  She notes irritation along her scar line from her breast reduction where it feels like sutures coming through and they are very irritating.  She denies any abnormal vaginal discharge, pelvic pain or other concerns.     Past Medical History:  Diagnosis Date   Allergy    Anemia    Anxiety    Cancer (HCC)    Depression    Sleep apnea    Vaginal Pap smear, abnormal     Past Surgical History:  Procedure Laterality Date   ABDOMINOPLASTY     BREAST REDUCTION WITH MASTOPEXY     BUNIONECTOMY     HYSTEROSCOPY WITH D & C     polypectomy   LAPAROSCOPIC GASTRIC SLEEVE RESECTION      The following portions of the patient's history were reviewed and updated as appropriate: allergies, current medications, past family history, past medical history, past social history, past surgical history and problem list.   Health Maintenance:   Normal pap and negative HRHPV Diagnosis  Date Value Ref Range Status  04/30/2021   Final   - Negative for intraepithelial lesion or malignancy (NILM)  2021: ASCUS/HPV pos, non 16/18  Normal mammogram on 06/2022.   Review of Systems:  Pertinent items noted in HPI and remainder of comprehensive ROS otherwise negative.  Physical Exam:  BP 118/78   Pulse 60   Ht 5\' 4"   (1.626 m)   Wt 226 lb (102.5 kg)   BMI 38.79 kg/m  CONSTITUTIONAL: Well-developed, well-nourished female in no acute distress.  HEENT:  Normocephalic, atraumatic. External right and left ear normal. No scleral icterus.  NECK: Normal range of motion, supple, no masses noted on observation SKIN: No rash noted. Not diaphoretic. No erythema. No pallor. MUSCULOSKELETAL: Normal range of motion. No edema noted. NEUROLOGIC: Alert and oriented to person, place, and time. Normal muscle tone coordination. No cranial nerve deficit noted. PSYCHIATRIC: Normal mood and affect. Normal behavior. Normal judgment and thought content. Breasts: breasts appear normal but has sutures eroding through skin after she had breast reduction, no suspicious masses, no skin or nipple changes or axillary nodes.  PELVIC: Normal appearing external genitalia, thickening of skin c/w lichen simplex; normal urethral meatus; normal appearing vaginal mucosa and cervix.  No abnormal discharge noted but diffusely tender on exam.  Normal uterine size, no other palpable masses, no uterine or adnexal tenderness. Performed in the presence of a chaperone  Labs and Imaging Results for orders placed or performed in visit on 04/27/23 (from the past 168 hour(s))  Urine Culture   Collection Time: 04/27/23  4:40 PM   Specimen: Urine  Result Value Ref Range   MICRO NUMBER: [id number]    SPECIMEN QUALITY: Adequate  Sample Source URINE    STATUS: FINAL    Result:      Less than 10,000 CFU/mL of single Gram positive organism isolated. No further testing will be performed. If clinically indicated, recollection using a method to minimize contamination, with prompt transfer to Urine Culture Transport Tube, is recommended.  POCT urinalysis dipstick   Collection Time: 04/28/23  8:06 AM  Result Value Ref Range   Color, UA yellow yellow   Clarity, UA clear clear   Glucose, UA negative negative mg/dL   Bilirubin, UA negative negative   Ketones,  POC UA negative negative mg/dL   Spec Grav, UA 4.098 1.191 - 1.025   Blood, UA negative negative   pH, UA 6.0 5.0 - 8.0   Protein Ur, POC negative negative mg/dL   Urobilinogen, UA 0.2 0.2 or 1.0 E.U./dL   Nitrite, UA Negative Negative   Leukocytes, UA Negative Negative   No results found.  Assessment and Plan:  Diagnoses and all orders for this visit:  History of breast problem -Reports sutures eroding through the skin and this is apparent on exam.  Will refer to plastic surgery. -     Ambulatory referral to Plastic Surgery  Dyspareunia in female If cultures are negative then we will treat with vaginal estrogen. -     Cervicovaginal ancillary only( [place])  Vulvar itching We will look for underlying infection with vaginal cultures.  Additionally does have evidence of lichen simplex.  Will do Kenalog taper and have her follow-up in 1 month -     Cervicovaginal ancillary only( [place]) -     triamcinolone (KENALOG) 0.025 % ointment; Apply 1 Application topically 2 (two) times daily. Twice a day for 2 weeks, then daily for 2 weeks then stop medication  Breast screening -     MM 3D SCREENING MAMMOGRAM BILATERAL BREAST; Future     Meds ordered this encounter  Medications   triamcinolone (KENALOG) 0.025 % ointment    Sig: Apply 1 Application topically 2 (two) times daily. Twice a day for 2 weeks, then daily for 2 weeks then stop medication    Dispense:  30 g    Refill:  0     Routine preventative health maintenance measures emphasized. Please refer to After Visit Summary for other counseling recommendations.   Return in about 1 month (around 06/04/2023) for annual, Follow up.  Milas Hock, MD, FACOG Obstetrician & Gynecologist, [facility] for [facility], [facility]

## 2023-05-05 MED ORDER — FLUCONAZOLE 150 MG PO TABS: 150.0000 mg | ORAL_TABLET | ORAL | 0 refills | Status: DC

## 2023-05-05 MED ORDER — METRONIDAZOLE 500 MG PO TABS: 500.0000 mg | ORAL_TABLET | Freq: Two times a day (BID) | ORAL | 0 refills | Status: DC

## 2023-05-05 NOTE — Addendum Note (Signed)
Addended by: Milas Hock A on: 05/05/2023 01:28 PM   Modules accepted: Orders

## 2023-05-09 ENCOUNTER — Other Ambulatory Visit: Payer: Self-pay

## 2023-05-09 ENCOUNTER — Telehealth: Payer: Self-pay

## 2023-05-09 DIAGNOSIS — A6004 Herpesviral vulvovaginitis: Secondary | ICD-10-CM

## 2023-05-09 MED ORDER — VALACYCLOVIR HCL 1 G PO TABS: 500.0000 mg | ORAL_TABLET | Freq: Every day | ORAL | 3 refills | Status: AC

## 2023-05-09 NOTE — Telephone Encounter (Signed)
Left message on voicemail if she has any questions regarding her results she can give [place] a call. Armandina Stammer RN

## 2023-05-09 NOTE — Progress Notes (Signed)
Patient confirmed she picked up medication for yeast and BV and has started taking them. She is feeling better. Patient requested a refill on her valtrex. Sydney Stammer RN

## 2023-05-17 ENCOUNTER — Other Ambulatory Visit: Payer: Self-pay

## 2023-05-17 DIAGNOSIS — L292 Pruritus vulvae: Secondary | ICD-10-CM

## 2023-05-17 DIAGNOSIS — B3731 Acute candidiasis of vulva and vagina: Secondary | ICD-10-CM

## 2023-05-17 MED ORDER — FLUCONAZOLE 150 MG PO TABS: 150.0000 mg | ORAL_TABLET | ORAL | 0 refills | Status: DC

## 2023-05-17 NOTE — Progress Notes (Signed)
Patient called stating she took Metronidazole for BV and Diflucan for yeast but she is still having vaginal itching, burning, and pain during intercourse.Advised patient to take another round of Diflucan and to not have intercourse for about a week.  Advised patient to call the office if symptoms persists. Diflucan 150 mg x 3 tablets was sent to her pharmacy. Understanding voiced.  Milano Rosevear l Ceceilia Cephus, CMA

## 2023-06-07 ENCOUNTER — Encounter: Payer: Self-pay | Admitting: Plastic Surgery

## 2023-06-07 ENCOUNTER — Ambulatory Visit (INDEPENDENT_AMBULATORY_CARE_PROVIDER_SITE_OTHER): Payer: Self-pay | Admitting: Plastic Surgery

## 2023-06-07 ENCOUNTER — Telehealth: Payer: Self-pay | Admitting: Plastic Surgery

## 2023-06-07 VITALS — BP 127/85 | HR 83 | Ht 64.0 in | Wt 226.4 lb

## 2023-06-07 DIAGNOSIS — M795 Residual foreign body in soft tissue: Secondary | ICD-10-CM | POA: Diagnosis not present

## 2023-06-07 NOTE — Telephone Encounter (Signed)
Pt Consult was cosmetic, Procedure is scheduled for Jul 05, 2023. Pt will need a quote before the procedure please. Thank you

## 2023-06-07 NOTE — Progress Notes (Signed)
Referring Provider Clayborne Dana, NP [address] [place],  [place] [address]   CC:  Chief Complaint  Patient presents with   Advice Only      Sydney Marshall is an 52 y.o. female.  HPI: Ms. Tam is a 52 year old female who presents today for complaints of breast pain in the left breast and a feeling like she has suture material in the incisions from a previous mastopexy.  Her appointment is conducted with a translator however her English is more than adequate. She underwent a mastopexy in [place] 5 or 6 years ago.  She noticed approximately a month ago that she started having what appeared to be sutures extruding through the incisions.  This coincided with the onset of her left breast pain.  She did have a mammogram in October 2023 which was BI-RADS 1.  Allergies  Allergen Reactions   Lactose Intolerance (Gi) Diarrhea and Nausea And Vomiting    Outpatient Encounter Medications as of 06/07/2023  Medication Sig   amitriptyline (ELAVIL) 10 MG tablet TAKE 1 TABLET(10 MG) BY MOUTH AT BEDTIME (Patient not taking: Reported on 05/04/2023)   fluconazole (DIFLUCAN) 150 MG tablet Take 1 tablet (150 mg total) by mouth every 3 (three) days. For three doses   meloxicam (MOBIC) 15 MG tablet TAKE 1 TABLET(15 MG) BY MOUTH DAILY   metroNIDAZOLE (FLAGYL) 500 MG tablet Take 1 tablet (500 mg total) by mouth 2 (two) times daily.   phenazopyridine (PYRIDIUM) 200 MG tablet Take 1 tablet (200 mg total) by mouth 3 (three) times daily as needed for pain.   triamcinolone (KENALOG) 0.025 % ointment Apply 1 Application topically 2 (two) times daily. Twice a day for 2 weeks, then daily for 2 weeks then stop medication   valACYclovir (VALTREX) 1000 MG tablet Take 0.5 tablets (500 mg total) by mouth daily.   No facility-administered encounter medications on file as of 06/07/2023.     Past Medical History:  Diagnosis Date   Allergy    Anemia    Anxiety    Cancer (HCC)     Depression    Sleep apnea    Vaginal Pap smear, abnormal     Past Surgical History:  Procedure Laterality Date   ABDOMINOPLASTY     BREAST REDUCTION WITH MASTOPEXY     BUNIONECTOMY     HYSTEROSCOPY WITH D & C     polypectomy   LAPAROSCOPIC GASTRIC SLEEVE RESECTION      Family History  Problem Relation Age of Onset   Learning disabilities Mother    Hyperlipidemia Mother    Diabetes Mother    Arthritis Mother    Hypertension Mother    Early death Father    Hypertension Father    Heart disease Father    Heart attack Father    Cancer Paternal Uncle        brain   Breast cancer Cousin     Social History   Social History Narrative   Not on file     Review of Systems General: Denies fevers, chills, weight loss CV: Denies chest pain, shortness of breath, palpitations Breast: Pain in the left breast and areas that she believes are sutures coming through the skin on both breasts  Physical Exam    06/07/2023    9:45 AM 05/04/2023   10:11 AM 04/27/2023    3:45 PM  Vitals with BMI  Height 5\' 4"  5\' 4"  5' 0.5"  Weight 226 lbs 6 oz  226 lbs 225 lbs  BMI 38.84 38.77 43.2  Systolic 127 118 161  Diastolic 85 78 80  Pulse 83 60 66    General:  No acute distress,  Alert and oriented, Non-Toxic, Normal speech and affect  Breast: The patient has well-healed incisions from previous mastopexy.  Nipples are normal in appearance.  On examination there is 1 small area at the vertical incision areolar incision junction where there may be a small suture palpable.  There are also sutures palpable and visible through the skin at the T point on the left breast.  On the right breast there is an area where there may be a suture palpable at the T point as well. Mammogram: BI-RADS 1 in October 2023 Assessment/Plan Status post mastopexy: Patient has what appears to be sutures extruding through the skin.  I have discussed with her that I can remove these in the office but I cannot in any way  guarantee that her pain will be resolved with removal of the sutures.  The patient understands and request that I proceed.  Will schedule her for an appointment in the office.  Photographs were obtained today with her consent.  Santiago Glad 06/07/2023, 10:03 AM

## 2023-06-09 ENCOUNTER — Telehealth: Payer: Self-pay | Admitting: Family Medicine

## 2023-06-09 NOTE — Telephone Encounter (Signed)
Lillia Abed ordered [facility] and [place] in August. Re-printed order and sent to number provided with confirmation received.

## 2023-06-09 NOTE — Telephone Encounter (Signed)
Patient called and needs a diagnosis sent to [phone] for the mammogram to be done.  Thank you

## 2023-07-05 ENCOUNTER — Ambulatory Visit: Payer: Self-pay | Admitting: Plastic Surgery

## 2023-07-05 DIAGNOSIS — M795 Residual foreign body in soft tissue: Secondary | ICD-10-CM

## 2023-07-19 NOTE — Progress Notes (Signed)
Patient elected to defer the procedure on this day she was not seen in the office.

## 2023-07-20 IMAGING — MG MM DIGITAL SCREENING BILAT W/ TOMO AND CAD
8 series · 8 of 24 positions shown · non-contrast
Comparison: Previous exam(s).

CLINICAL DATA: Screening.

EXAM:
DIGITAL SCREENING BILATERAL MAMMOGRAM WITH TOMOSYNTHESIS AND CAD
TECHNIQUE: Bilateral screening digital craniocaudal and mediolateral oblique
mammograms were obtained. Bilateral screening digital breast
tomosynthesis was performed. The images were evaluated with
computer-aided detection.

[L CC synth-2D]
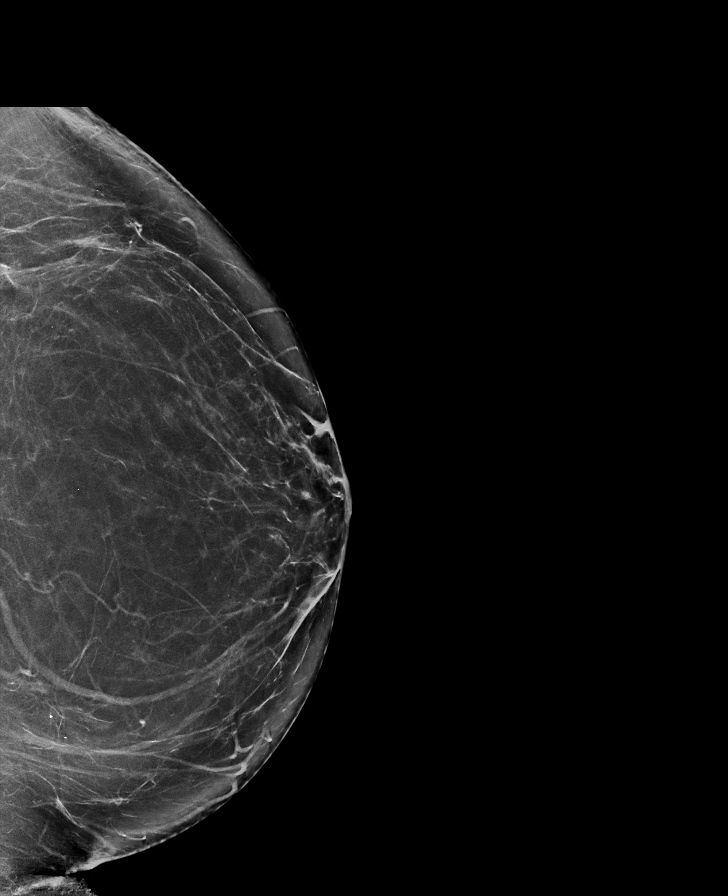

[R MLO synth-2D]
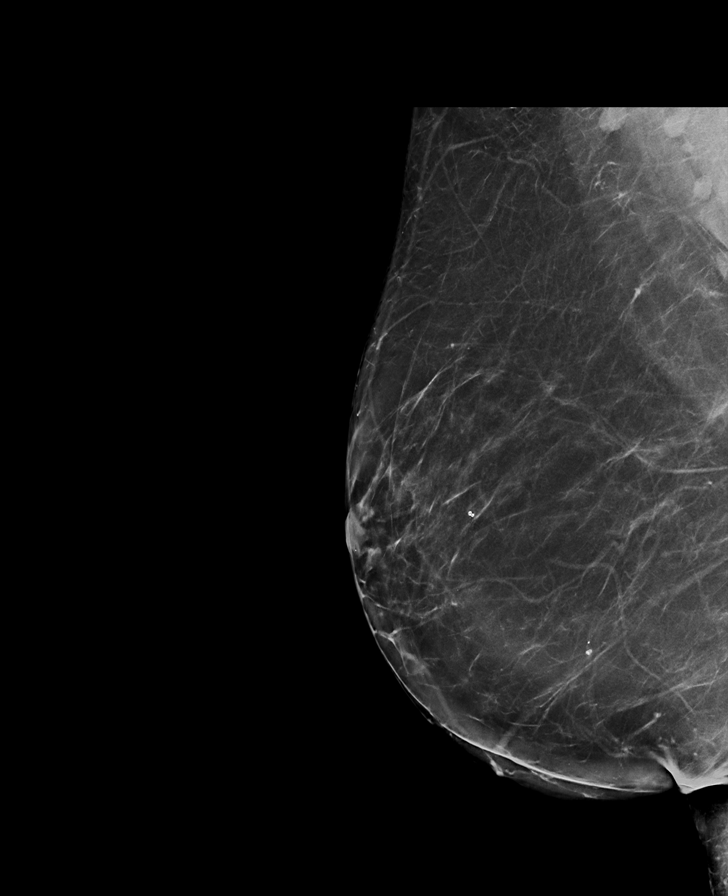

[L MLO synth-2D]
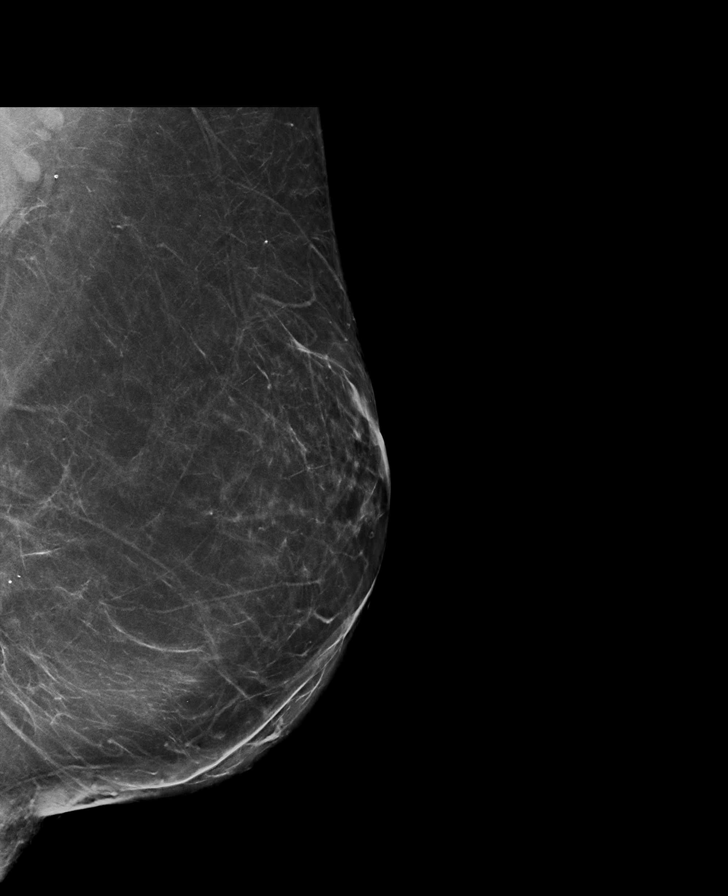

[R CC synth-2D]
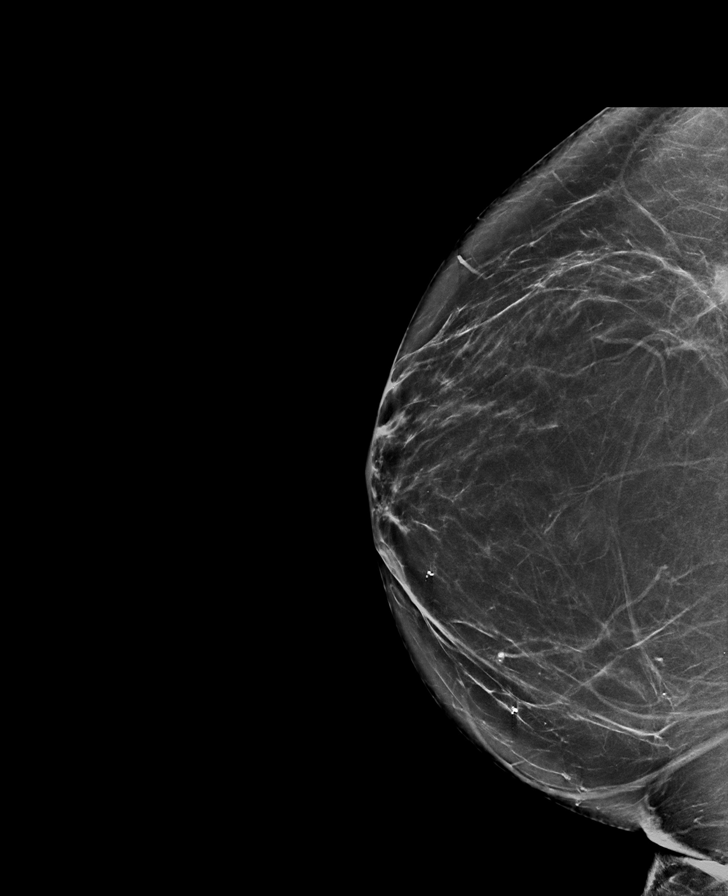

[R CC tomo · tomo slice 39/77.0]
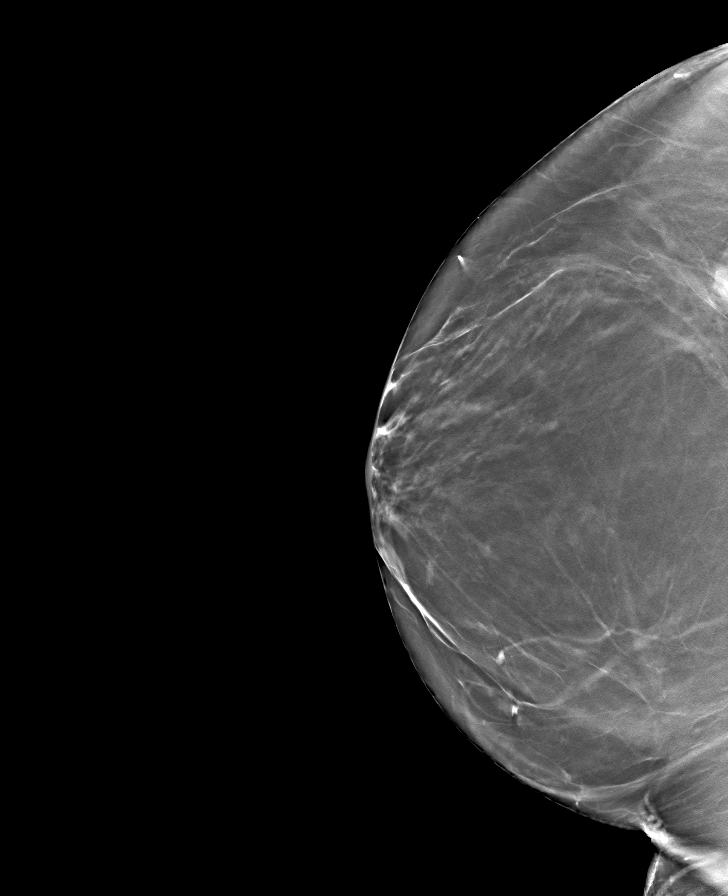

[R MLO tomo · tomo slice 43/86.0]
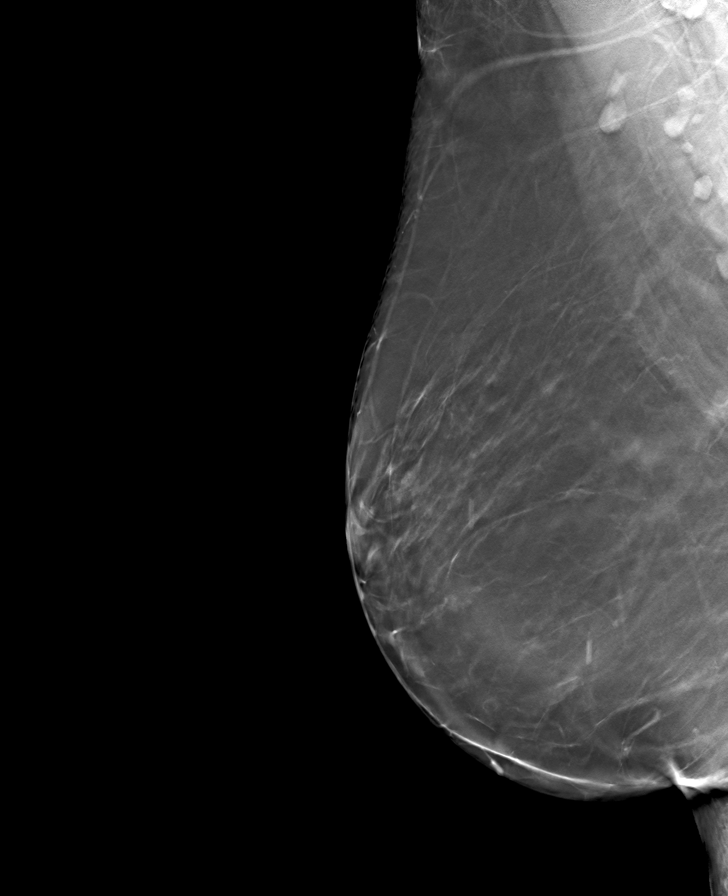

[L MLO tomo · tomo slice 47/92.0]
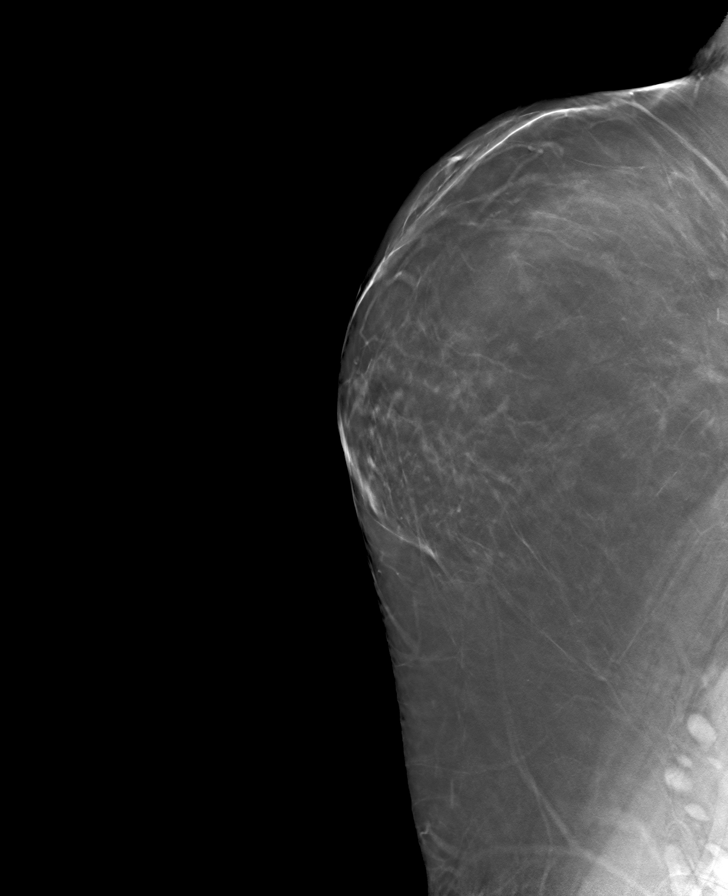

[L CC tomo · tomo slice 43/84.0]
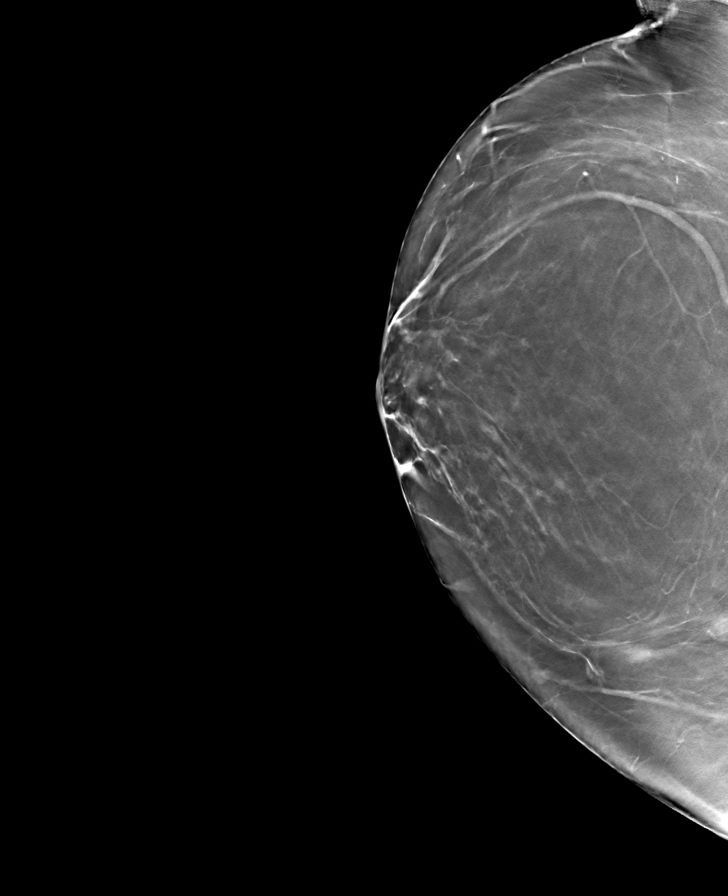

[8 of 24 positions shown; findings below may reference images not displayed]

ACR Breast Density Category b: There are scattered areas of
fibroglandular density.
FINDINGS: There are no findings suspicious for malignancy.
IMPRESSION: No mammographic evidence of malignancy. A result letter of this
screening mammogram will be mailed directly to the patient.

RECOMMENDATION:
Screening mammogram in one year. (Code:51-O-LD2)

BI-RADS CATEGORY  1: Negative.

## 2023-10-18 ENCOUNTER — Ambulatory Visit: Payer: Self-pay | Admitting: Family Medicine

## 2023-10-18 NOTE — Telephone Encounter (Signed)
Copied from CRM [id number]. Topic: Clinical - Red Word Triage >> Oct 18, 2023  5:30 PM Alvino Blood C wrote: Red Word that prompted transfer to Nurse Triage: Patient had stiches placed and they have come undone. She says it's causing a little pain and discomfort   Chief Complaint: Suture coming through skin Disposition: [] ED /[] Urgent Care (no appt availability in office) / [x] Appointment(In office/virtual)/ []  [place] [place] [facility]/ [] Home Care/ [] Refused Recommended Disposition /[] [facility]/ []  Follow-up with PCP Additional Notes: Patient reports she had sutures placed in her left breast, she believes in 2019. She states that 3 days ago she noticed that there was a suture sticking out of her skin under her left breast and would like to have it examined and removed. Appointment made for patient tomorrow for evaluation of the suture.    Reason for Disposition  Suture or staple removal is overdue  Answer Assessment - Initial Assessment Questions 1. LOCATION: "Where are the sutures (or staples) located?"      Left breast  2. NUMBER: "How many sutures (or staples) are there?"      Unsure  3. DATE IN: "When were the sutures (or staples) put in?"       2019  Protocols used: Suture or Staple Questions-A-AH

## 2023-10-19 ENCOUNTER — Ambulatory Visit: Payer: Medicaid Other | Admitting: Family Medicine

## 2023-10-19 ENCOUNTER — Encounter: Payer: Self-pay | Admitting: Family Medicine

## 2023-10-19 VITALS — BP 132/82 | HR 72 | Ht 64.0 in | Wt 226.0 lb

## 2023-10-19 DIAGNOSIS — T8131XA Disruption of external operation (surgical) wound, not elsewhere classified, initial encounter: Secondary | ICD-10-CM | POA: Diagnosis not present

## 2023-10-19 DIAGNOSIS — Z0184 Encounter for antibody response examination: Secondary | ICD-10-CM

## 2023-10-19 NOTE — Progress Notes (Signed)
   Acute Office Visit  Subjective:     Patient ID: Sydney Marshall, female    DOB: 1971/08/28, 53 y.o.   MRN: [id number]  Chief Complaint  Patient presents with   Wound Check     Patient is in today for skin check.  Discussed the use of AI scribe software for clinical note transcription with the patient, who gave verbal consent to proceed.  History of Present Illness   The patient, with a history of mastopexy in 2019, presents with a suture that has recently surfaced from under the skin. They first noticed discomfort in the area last year. The patient reports feeling the suture pressing against their skin for a long time, causing discomfort. They had previously consulted a plastic surgeon, but the suture was not removed due to the high cost of the procedure. The patient also reports feeling another potential suture under the skin in a different location.       ROS All review of systems negative except what is listed in the HPI      Objective:    BP 132/82   Pulse 72   Ht 5\' 4"  (1.626 m)   Wt 226 lb (102.5 kg)   SpO2 100%   BMI 38.79 kg/m    Physical Exam Vitals reviewed.  Constitutional:      General: She is not in acute distress.    Appearance: Normal appearance. She is not ill-appearing.  Skin:    Comments: See picture of suture on left breast  Neurological:     Mental Status: She is alert and oriented to person, place, and time.  Psychiatric:        Mood and Affect: Mood normal.        Behavior: Behavior normal.        Thought Content: Thought content normal.        Judgment: Judgment normal.       No results found for any visits on 10/19/23.      Assessment & Plan:   Problem List Items Addressed This Visit   None Visit Diagnoses       Immunity status testing    -  Primary Needs some testing for school   Relevant Orders   Hepatitis B surface antibody,quantitative   Varicella-Zoster Virus Antibody (Immunity Screen), ACIF ,Serum    Measles/Mumps/Rubella Immunity   QuantiFERON-TB Gold Plus     Extrusion of suture, initial encounter     Suture from 2019 surgery has extruded through the skin. Another suture is palpable under the skin. No signs of infection. -Removed extruded suture in office without complications. -Advised to keep area clean with soap and water, apply Neosporin, and monitor for signs of infection. -Recommended to wait for other suture to extrude naturally or consider surgical removal by a specialist if it becomes bothersome.       Suture Removal  Date/Time: 10/19/2023 11:42 AM  Performed by: Clayborne Dana, NP Authorized by: Clayborne Dana, NP  Body area: trunk Location details: left breast Wound Appearance: clean Sutures Removed: 1 Post-removal: dressing applied and antibiotic ointment applied Patient tolerance: patient tolerated the procedure well with no immediate complications      No orders of the defined types were placed in this encounter.     Return if symptoms worsen or fail to improve.  Clayborne Dana, NP

## 2023-10-25 ENCOUNTER — Telehealth: Payer: Self-pay | Admitting: *Deleted

## 2023-10-25 NOTE — Telephone Encounter (Signed)
FYI Patient came in and demanded that she get immunizations today for her college form.  Advised her that she was suppose to have gotten labs done on 1/23 but she did not.  She stated that she is worried about time and does not want to do labwork.  She is unable to get past immunizations due [place].  I advised her that that her provider was out for the next couple of days and that if she gets blood work today they should be back within 2-5 days.  Then her provider can sign form.  Also advised her that she will need blood test for tb anyway but she still declined.  She stated that she will go elsewhere to have it done.  I advised her that she may run into the same problem since she does not have copy of her vaccinations.
